# Patient Record
Sex: Female | Born: 1945 | Race: White | Hispanic: No | Marital: Married | Smoking: Never smoker
Health system: Southern US, Community
[De-identification: ages and names within clinical notes are randomized; demographics above are authoritative.]

## PROBLEM LIST (undated history)

## (undated) DIAGNOSIS — C801 Malignant (primary) neoplasm, unspecified: Secondary | ICD-10-CM

## (undated) DIAGNOSIS — C50919 Malignant neoplasm of unspecified site of unspecified female breast: Secondary | ICD-10-CM

## (undated) DIAGNOSIS — M549 Dorsalgia, unspecified: Secondary | ICD-10-CM

## (undated) DIAGNOSIS — K5790 Diverticulosis of intestine, part unspecified, without perforation or abscess without bleeding: Secondary | ICD-10-CM

## (undated) DIAGNOSIS — R739 Hyperglycemia, unspecified: Secondary | ICD-10-CM

## (undated) DIAGNOSIS — G8929 Other chronic pain: Secondary | ICD-10-CM

## (undated) DIAGNOSIS — H409 Unspecified glaucoma: Secondary | ICD-10-CM

## (undated) DIAGNOSIS — Z923 Personal history of irradiation: Secondary | ICD-10-CM

## (undated) DIAGNOSIS — R112 Nausea with vomiting, unspecified: Secondary | ICD-10-CM

## (undated) DIAGNOSIS — Z9889 Other specified postprocedural states: Secondary | ICD-10-CM

## (undated) DIAGNOSIS — D369 Benign neoplasm, unspecified site: Secondary | ICD-10-CM

## (undated) DIAGNOSIS — Z808 Family history of malignant neoplasm of other organs or systems: Secondary | ICD-10-CM

## (undated) DIAGNOSIS — M8580 Other specified disorders of bone density and structure, unspecified site: Secondary | ICD-10-CM

## (undated) DIAGNOSIS — J302 Other seasonal allergic rhinitis: Secondary | ICD-10-CM

## (undated) DIAGNOSIS — N189 Chronic kidney disease, unspecified: Secondary | ICD-10-CM

## (undated) DIAGNOSIS — Z803 Family history of malignant neoplasm of breast: Secondary | ICD-10-CM

## (undated) DIAGNOSIS — K219 Gastro-esophageal reflux disease without esophagitis: Secondary | ICD-10-CM

## (undated) HISTORY — PX: VAGINAL HYSTERECTOMY: SUR661

## (undated) HISTORY — DX: Other seasonal allergic rhinitis: J30.2

## (undated) HISTORY — PX: CATARACT EXTRACTION: SUR2

## (undated) HISTORY — DX: Diverticulosis of intestine, part unspecified, without perforation or abscess without bleeding: K57.90

## (undated) HISTORY — DX: Hyperglycemia, unspecified: R73.9

## (undated) HISTORY — PX: KIDNEY CYST REMOVAL: SHX684

## (undated) HISTORY — DX: Malignant neoplasm of unspecified site of unspecified female breast: C50.919

## (undated) HISTORY — DX: Other chronic pain: G89.29

## (undated) HISTORY — DX: Chronic kidney disease, unspecified: N18.9

## (undated) HISTORY — DX: Family history of malignant neoplasm of breast: Z80.3

## (undated) HISTORY — DX: Unspecified glaucoma: H40.9

## (undated) HISTORY — DX: Benign neoplasm, unspecified site: D36.9

## (undated) HISTORY — DX: Dorsalgia, unspecified: M54.9

## (undated) HISTORY — DX: Other specified disorders of bone density and structure, unspecified site: M85.80

## (undated) HISTORY — DX: Gastro-esophageal reflux disease without esophagitis: K21.9

## (undated) HISTORY — PX: APPENDECTOMY: SHX54

## (undated) HISTORY — PX: BREAST CYST EXCISION: SHX579

## (undated) HISTORY — PX: BREAST LUMPECTOMY: SHX2

## (undated) HISTORY — DX: Family history of malignant neoplasm of other organs or systems: Z80.8

---

## 2011-12-02 ENCOUNTER — Inpatient Hospital Stay (HOSPITAL_COMMUNITY): Payer: Medicare Other

## 2011-12-02 ENCOUNTER — Inpatient Hospital Stay (HOSPITAL_COMMUNITY)
Admission: AD | Admit: 2011-12-02 | Discharge: 2011-12-02 | Disposition: A | Discharge status: Home / Self Care | Payer: Medicare Other | Source: Ambulatory Visit | Attending: Obstetrics & Gynecology | Admitting: Obstetrics & Gynecology

## 2011-12-02 ENCOUNTER — Encounter (HOSPITAL_COMMUNITY): Payer: Self-pay | Admitting: *Deleted

## 2011-12-02 DIAGNOSIS — R109 Unspecified abdominal pain: Secondary | ICD-10-CM | POA: Insufficient documentation

## 2011-12-02 DIAGNOSIS — N289 Disorder of kidney and ureter, unspecified: Secondary | ICD-10-CM | POA: Insufficient documentation

## 2011-12-02 DIAGNOSIS — N390 Urinary tract infection, site not specified: Secondary | ICD-10-CM | POA: Insufficient documentation

## 2011-12-02 HISTORY — DX: Unspecified glaucoma: H40.9

## 2011-12-02 LAB — URINALYSIS, ROUTINE W REFLEX MICROSCOPIC
Bilirubin Urine: NEGATIVE
Glucose, UA: NEGATIVE mg/dL
Ketones, ur: NEGATIVE mg/dL
Nitrite: NEGATIVE
Specific Gravity, Urine: >1.03 — ABNORMAL HIGH (ref 1.005–1.030)
pH: 5.5 (ref 5.0–8.0)

## 2011-12-02 LAB — CBC
HCT: 38.7 % (ref 36.0–46.0)
Hemoglobin: 13.1 g/dL (ref 12.0–15.0)
MCH: 30.8 pg (ref 26.0–34.0)
MCHC: 33.9 g/dL (ref 30.0–36.0)
MCV: 91.1 fL (ref 78.0–100.0)
RBC: 4.25 MIL/uL (ref 3.87–5.11)

## 2011-12-02 LAB — URINE MICROSCOPIC-ADD ON

## 2011-12-02 MED ORDER — CEPHALEXIN 500 MG PO CAPS: 500.0000 mg | ORAL_CAPSULE | Freq: Three times a day (TID) | ORAL | Status: AC

## 2011-12-02 MED ORDER — FLAVOXATE HCL 100 MG PO TABS
100.0000 mg | ORAL_TABLET | Freq: Three times a day (TID) | ORAL | Status: DC | PRN
  Administered 2011-12-02: 100 mg via ORAL
  Filled 2011-12-02: qty 1

## 2011-12-02 MED ORDER — LACTATED RINGERS IV BOLUS (SEPSIS): 1000.0000 mL | Freq: Once | INTRAVENOUS | Status: DC

## 2011-12-02 MED ORDER — KETOROLAC TROMETHAMINE 60 MG/2ML IM SOLN
60.0000 mg | Freq: Once | INTRAMUSCULAR | Status: AC
  Administered 2011-12-02: 60 mg via INTRAMUSCULAR
  Filled 2011-12-02: qty 2

## 2011-12-02 MED ORDER — DEXTROSE 5 % IV SOLN
1.0000 g | Freq: Once | INTRAVENOUS | Status: AC
  Administered 2011-12-02: 1 g via INTRAVENOUS
  Filled 2011-12-02: qty 10

## 2011-12-02 MED ORDER — SODIUM CHLORIDE 0.9 % IV BOLUS (SEPSIS)
1000.0000 mL | Freq: Once | INTRAVENOUS | Status: AC
  Administered 2011-12-02: 1000 mL via INTRAVENOUS

## 2011-12-02 MED ORDER — FLAVOXATE HCL 100 MG PO TABS: 100.0000 mg | ORAL_TABLET | Freq: Three times a day (TID) | ORAL | Status: DC | PRN

## 2011-12-02 NOTE — MAU Provider Note (Signed)
History     CSN: [id number]  Arrival date and time: 12/02/11 1033   First Provider Initiated Contact with Patient 12/02/11 1124      No chief complaint on file.  HPI Pt is not pregnant(s/p hyst- has ovaries) and presents with bladder/lower abdominal pain and feeling bloated. She was seen at her doctor's office on Thursday Aug 1 and diagnosed with a UTI.  She was treated with Macrobid, however, pt had nausea and vomiting and chills and fever.  Pt was changed to Cipro but culture and sensitivity showed resistant to Cipro.  Pt was was given an injection or Rocephin on Friday Aug 2 in the office which seemed to help the pt's pain as well as an injection of ToradolYesterday, Sat Aug 2, pt's MD called to tell her the urine culture showed resistance to Cipro and was told to return to [facility] and was also given a presciption for Phenergan. Pt has continued to have nausea and vomiting and chills along with lower abdominal pain, frequency of urination, dysuria and feeling of incomplete emptying..had an injection Friday afternoon of Rocephin. 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  He goes to [facility] and sees for benign tumor on kidney 3 years ago.  She took Tylenol and Motrin for the fever and pain last took yesterday .  She is aching and uncomfortable in her lower abdomen.  Past Medical History  Diagnosis Date  . Glaucoma (increased eye pressure)     Past Surgical History  Procedure Date  . Abdominal hysterectomy   . Kidney cyst removal     No family history on file.  History  Substance Use Topics  . Smoking status: Never Smoker   . Smokeless tobacco: Not on file  . Alcohol Use: No    Allergies:  Allergies  Allergen Reactions  . Bactrim (Sulfamethoxazole W-Trimethoprim) Nausea And Vomiting    Prescriptions prior to admission  Medication Sig Dispense Refill  . acetaminophen (TYLENOL) 500 MG tablet Take 1,000 mg by mouth daily  as needed. For pain and fever      . Calcium Carbonate-Vitamin D (CALTRATE 600+D) 600-400 MG-UNIT per tablet Take 1 tablet by mouth 2 (two) times daily.      . CefTRIAXone Sodium (ROCEPHIN IJ) Inject 1 Syringe as directed once. Pt got a shot at the Dr Felicie Morn [place] in [place], [place]      . cholecalciferol (VITAMIN D) 1000 UNITS tablet Take 1,000 Units by mouth daily.      . ciprofloxacin (CIPRO) 500 MG tablet Take 500 mg by mouth 2 (two) times daily. Dr told her to stop taking Friday 11/30/2011      . Cyanocobalamin (VITAMIN B 12 PO) Take 1 tablet by mouth every morning.      . folic acid (FOLVITE) 400 MCG tablet Take 400 mcg by mouth daily.      Marland Kitchen ibuprofen (ADVIL,MOTRIN) 200 MG tablet Take 400 mg by mouth daily as needed. For pain      . nitrofurantoin, macrocrystal-monohydrate, (MACROBID) 100 MG capsule Take 100 mg by mouth 2 (two) times daily.      . promethazine (PHENERGAN) 25 MG tablet Take 25 mg by mouth every 8 (eight) hours as needed. For nausea      . timolol (TIMOPTIC) 0.5 % ophthalmic solution Place 1 drop into both eyes daily.      . vitamin E 400 UNIT capsule Take  400 Units by mouth daily.        Review of Systems  Constitutional: Positive for fever and chills.  Gastrointestinal: Positive for nausea, vomiting and abdominal pain. Negative for diarrhea and constipation.  Genitourinary: Positive for dysuria, urgency and frequency.  Musculoskeletal: Negative for myalgias.   Physical Exam   Blood pressure 99/74, pulse 96, temperature 97.8 F (36.6 C), temperature source Oral, resp. rate 18.  Physical Exam  Vitals reviewed. Constitutional: She is oriented to person, place, and time. She appears well-developed and well-nourished.  HENT:  Head: Normocephalic.  Eyes: Pupils are equal, round, and reactive to light.  Neck: Normal range of motion. Neck supple.  Cardiovascular: Normal rate.   Respiratory: Effort normal.  GI: Soft. Bowel sounds are normal. She  exhibits no distension. There is tenderness. There is no rebound and no guarding.  Musculoskeletal: Normal range of motion.  Neurological: She is alert and oriented to person, place, and time.  Skin: Skin is warm and dry.  Psychiatric: She has a normal mood and affect.    MAU Course  Procedures  Clinical Data: Pain  RENAL/URINARY TRACT ULTRASOUND COMPLETE  Comparison: None  Findings:  Right Kidney: Right kidney measures 9.8 cm. Normal in size and  parenchymal echogenicity. No evidence of mass or hydronephrosis.  Left Kidney: Left kidney measures 10.3 cm.Normal in size and  parenchymal echogenicity. There is a rounded, partially exophytic  structure arising from the mid to lower pole of the left kidney.  There is associated area of increased echogenicity and posterior  acoustic shadowing within this area which may represent  calcification.  Bladder: Appears normal for degree of bladder distention.  IMPRESSION:  1. No evidence for obstructive uropathy.  2. Indeterminant, partially exophytic solid structure arising from  the mid to inferior pole the left kidney. This may be related to  prior surgery. Without prior imaging for comparison I cannot rule  out the possibility of underlying mass lesion. Suggest follow-up  non emergent CT or MRI with contrast material for further  evaluation.  Original Report Authenticated By: Rosealee Albee, M.D.            External Result Report    Clinical Data: Pain  TRANSABDOMINAL AND TRANSVAGINAL ULTRASOUND OF PELVIS  Technique: Both transabdominal and transvaginal ultrasound  examinations of the pelvis were performed. Transabdominal  technique was performed for global imaging of the pelvis including  uterus, ovaries, adnexal regions, and pelvic cul-de-sac.  It was necessary to proceed with endovaginal exam following the  transabdominal exam to visualize the ovaries .  Comparison: None  Findings:  Uterus: Prior hysterectomy. Not  applicable.  Endometrium: Prior hysterectomy. Not applicable.  Right ovary: Appears normal measuring 3.0 x 1.5 x 1.3 cm.  Left ovary:  Appears normal measuring 2.0 x 0.8 x 1.4 cm.  Other Findings: No free fluid  IMPRESSION:  1. Normal appearance of the pelvis status post hysterectomy. No  acute findings noted.  Original Report Authenticated By: Rosealee Albee, M.D.     Pt given Toradol 60 mg IM and also Rocephin 1 gm IV Pt also given Urispas- pt felt much relief of pain    Assessment and Plan  UTI and abnormal appearing structure on left kidney on renal MRI Hx prior kidney surgery for benign tumor Pt discharged home with prescription for Keflex Advised pt to f/u with PCP and nephrologist for re-eval of kidneys Copy of ultrasound report given to pt to take to physician  [facility] 12/02/2011, 11:26 AM

## 2011-12-02 NOTE — MAU Note (Signed)
Pt reports she went to doctor on Thursday told she had a bladder infection. Given macrobid. She took it that night and got very sick to her stomach. Talked to dr on call and changed her to  Cipro and gave her phenergan for nausea. Md called her yesterday and told her the urine culture cipro would not treat her bladder infection to go back on macrobid. Pt took it last night and vomited again. Reports chills and fever and pain today. (gets care in [place]).

## 2011-12-06 NOTE — MAU Provider Note (Signed)
Attestation of Attending Supervision of Advanced Practitioner (CNM/NP): Evaluation and management procedures were performed by the Advanced Practitioner under my supervision and collaboration.  I have reviewed the Advanced Practitioner's note and chart, and I agree with the management and plan.  HARRAWAY-SMITH, Alynah Schone 3:57 PM     

## 2013-09-29 ENCOUNTER — Inpatient Hospital Stay (HOSPITAL_COMMUNITY)
Admission: AD | Admit: 2013-09-29 | Discharge: 2013-09-29 | Disposition: A | Discharge status: Home / Self Care | Payer: MEDICARE | Source: Ambulatory Visit | Attending: Obstetrics & Gynecology | Admitting: Obstetrics & Gynecology

## 2013-09-29 ENCOUNTER — Encounter (HOSPITAL_COMMUNITY): Payer: Self-pay | Admitting: *Deleted

## 2013-09-29 ENCOUNTER — Inpatient Hospital Stay (HOSPITAL_COMMUNITY): Payer: MEDICARE

## 2013-09-29 DIAGNOSIS — R102 Pelvic and perineal pain: Secondary | ICD-10-CM

## 2013-09-29 DIAGNOSIS — B373 Candidiasis of vulva and vagina: Secondary | ICD-10-CM | POA: Insufficient documentation

## 2013-09-29 DIAGNOSIS — R109 Unspecified abdominal pain: Secondary | ICD-10-CM | POA: Insufficient documentation

## 2013-09-29 DIAGNOSIS — R300 Dysuria: Secondary | ICD-10-CM | POA: Insufficient documentation

## 2013-09-29 DIAGNOSIS — N390 Urinary tract infection, site not specified: Secondary | ICD-10-CM | POA: Insufficient documentation

## 2013-09-29 DIAGNOSIS — B3731 Acute candidiasis of vulva and vagina: Secondary | ICD-10-CM | POA: Insufficient documentation

## 2013-09-29 LAB — URINALYSIS, ROUTINE W REFLEX MICROSCOPIC
BILIRUBIN URINE: NEGATIVE
Glucose, UA: NEGATIVE mg/dL
HGB URINE DIPSTICK: NEGATIVE
Ketones, ur: NEGATIVE mg/dL
Nitrite: NEGATIVE
PROTEIN: NEGATIVE mg/dL
SPECIFIC GRAVITY, URINE: 1.02 (ref 1.005–1.030)
UROBILINOGEN UA: 0.2 mg/dL (ref 0.0–1.0)
pH: 6 (ref 5.0–8.0)

## 2013-09-29 LAB — URINE MICROSCOPIC-ADD ON

## 2013-09-29 MED ORDER — OXYBUTYNIN CHLORIDE 5 MG PO TABS: 5.0000 mg | ORAL_TABLET | Freq: Three times a day (TID) | ORAL | Status: DC | PRN

## 2013-09-29 MED ORDER — CEFPODOXIME PROXETIL 200 MG PO TABS: 200.0000 mg | ORAL_TABLET | Freq: Two times a day (BID) | ORAL | Status: DC

## 2013-09-29 MED ORDER — FLUCONAZOLE 150 MG PO TABS
150.0000 mg | ORAL_TABLET | Freq: Once | ORAL | Status: DC
Start: 2013-09-29 — End: 2016-12-25

## 2013-09-29 MED ORDER — FLUCONAZOLE 150 MG PO TABS
150.0000 mg | ORAL_TABLET | Freq: Once | ORAL | Status: AC
  Administered 2013-09-29: 150 mg via ORAL
  Filled 2013-09-29: qty 1

## 2013-09-29 NOTE — MAU Provider Note (Signed)
History     CSN: [id number]  Arrival date and time: 09/29/13 1552   None     CC: Bladder pain, UTI x 1 month  HPI  Ms Denslow is a 68 yo who presents with bladder pain, dysuria, frequency and urgency ongoing for about a month. Recently treatedby PCP and GYN in [place], [place] for UTI with 2 courses of Cipro, unclear if urine culture was ever collected. Her symptoms have not improved. She was seen by her PCP today and was told there was no evidence of infeection in her urine and PCP recommended coming to [facility] for further eval, apparently for suspicion of ovarian process. Last dose of cipro was 4 days ago.   In further discussion with the pt, she states that the urinary frequency improved but she still feels dysuria from time to time and vague pelvic pressure.  The other new symptoms that has progressively been worsening is that when she sits it burns on her exterior skin all the way around her vagina and down to her bottom.  Treated by her gyn with 2 courses of cipro and then told to see pcp since it did not improve. When she went to her PCP he was concerned she seemed bloated and distended and wanted her to have a CT scan and urology referral. Pt asked if she could come to [facility] and that is why she is here.   No fevers, chills, nausea, vomiting, diarrhea, constipation, weight changes.     Past Medical History  Diagnosis Date  . Glaucoma (increased eye pressure)     Past Surgical History  Procedure Laterality Date  . Abdominal hysterectomy    . Kidney cyst removal      No family history on file.  History  Substance Use Topics  . Smoking status: Never Smoker   . Smokeless tobacco: Not on file  . Alcohol Use: No    Allergies:  Allergies  Allergen Reactions  . Bactrim [Sulfamethoxazole-Trimethoprim] Nausea And Vomiting    Prescriptions prior to admission  Medication Sig Dispense Refill  . acetaminophen (TYLENOL) 500 MG tablet Take 1,000 mg by mouth daily as  needed. For pain and fever      . Calcium Carbonate-Vitamin D (CALTRATE 600+D) 600-400 MG-UNIT per tablet Take 1 tablet by mouth 2 (two) times daily.      . cholecalciferol (VITAMIN D) 1000 UNITS tablet Take 1,000 Units by mouth daily.      . Cyanocobalamin (VITAMIN B 12 PO) Take 1 tablet by mouth every morning.      . flavoxATE (URISPAS) 100 MG tablet Take 1 tablet (100 mg total) by mouth 3 (three) times daily as needed.  30 tablet  0  . folic acid (FOLVITE) 563 MCG tablet Take 400 mcg by mouth daily.      Marland Kitchen ibuprofen (ADVIL,MOTRIN) 200 MG tablet Take 400 mg by mouth daily as needed. For pain      . promethazine (PHENERGAN) 25 MG tablet Take 25 mg by mouth every 8 (eight) hours as needed. For nausea      . timolol (TIMOPTIC) 0.5 % ophthalmic solution Place 1 drop into both eyes daily.      . vitamin E 400 UNIT capsule Take 400 Units by mouth daily.        Review of Systems  Constitutional: Negative for fever, chills and weight loss.  HENT: Negative for sore throat.   Eyes: Negative for blurred vision, double vision and pain.  Respiratory: Negative for cough,  shortness of breath and wheezing.   Cardiovascular: Negative for chest pain and palpitations.  Gastrointestinal: Positive for abdominal pain. Negative for heartburn, nausea, vomiting, diarrhea, constipation and blood in stool.       Suprapubic pain, burning, cramping, radiating around to LLQ and RLQ but pain here is milder  Genitourinary: Positive for dysuria, urgency, frequency and flank pain. Negative for hematuria.  Musculoskeletal: Positive for back pain. Negative for myalgias.       Chronic back pain, this pain today is different   Skin: Negative for rash.  Neurological: Negative for dizziness, loss of consciousness and headaches.  Endo/Heme/Allergies: Does not bruise/bleed easily.   Physical Exam   Blood pressure 135/79, pulse 62, temperature 97.7 F (36.5 C), temperature source Oral, resp. rate 18, height 5' 3.5" (1.613 m),  weight 74.39 kg (164 lb).  UA: yellow, clear, Neg Glc/Bil/Ketone/Protein/Nitrite/Hgb. Mod Leuk, 21-50 WBC, Few sq. epith.  Physical Exam  Constitutional: She is oriented to person, place, and time. She appears well-developed and well-nourished.  HENT:  Head: Normocephalic and atraumatic.  Eyes: EOM are normal. Pupils are equal, round, and reactive to light.  Neck: Normal range of motion. Neck supple.  Cardiovascular: Normal rate, regular rhythm and normal heart sounds.   No murmur heard. Respiratory: Effort normal and breath sounds normal. No respiratory distress. She has no wheezes.  GI: Soft. She exhibits no distension. There is tenderness. There is no rebound and no guarding.  TTP suprapubic  Genitourinary:  External erythema and irritation around both labia and perianal. Normal vaginal mucosa and intact vaginal cuff. No adnexal tenderness or fullness.   Musculoskeletal: Normal range of motion.  Neg Lloyd's sign bilaterally  Neurological: She is alert and oriented to person, place, and time.  Skin: Skin is warm and dry. She is not diaphoretic.    MAU Course  Procedures  MDM UA Urine cx  US pelvis  Exam   Assessment and Plan   68 yo who presents with pelvic pain and urinary urgency and dysuria after recent UTI and was sent for further imaging.    Urinary tract infection - UA with only leukocytes today - will send for cx and only treat if positive - suspect some of her symptoms are coming from bladder spasm - rx of oxybutinin for several weeks to see if this improves some.    Lower Abdominal pain - per PCP also with distension although none noted today on exam - US of the pelvis obtained to r/o ovarian process and this was negative.   - reassurance provided that this could likely be also due to bladder spasm as above.    Yeast infection -likely source of her burning pain - diflucan today - rx for repeat dose if not gone in 3 days  F/u with PCP in [place] if no  improvement or worsening.     Shaquella Stamant L Avangelina Flight 09/29/2013, 5:31 PM

## 2013-09-29 NOTE — MAU Note (Addendum)
Pt states pain started May 5 th with a bladder infection. Pt had 10 days of cipro and another round for 7 days. Pt states she did feel better after the first round of Anitbiotiics but never felt completely better.

## 2013-09-29 NOTE — Progress Notes (Signed)
Bimanual performed by Dr Olevia Bowens redness and irritation noted

## 2013-09-29 NOTE — MAU Note (Signed)
5th of May, dx with UTI; finished 2nd round of Cipro last Thurs (10day then 7 day).  Not feeling any better, called GYN- told to see primary.  Saw primary this morning- urine was clear.  Very tender, needs a CT.

## 2013-09-29 NOTE — Discharge Instructions (Signed)
Candidal Vulvovaginitis  Candidal vulvovaginitis is an infection of the vagina and vulva. The vulva is the skin around the opening of the vagina. This may cause itching and discomfort in and around the vagina.   HOME CARE  · Only take medicine as told by your doctor.  · Do not have sex (intercourse) until the infection is healed or as told by your doctor.  · Practice safe sex.  · Tell your sex partner about your infection.  · Do not douche or use tampons.  · Wear cotton underwear. Do not wear tight pants or panty hose.  · Eat yogurt. This may help treat and prevent yeast infections.  GET HELP RIGHT AWAY IF:   · You have a fever.  · Your problems get worse during treatment or do not get better in 3 days.  · You have discomfort, irritation, or itching in your vagina or vulva area.  · You have pain after sex.  · You start to get belly (abdominal) pain.  MAKE SURE YOU:  · Understand these instructions.  · Will watch your condition.  · Will get help right away if you are not doing well or get worse.  Document Released: 07/13/2008 Document Revised: 07/09/2011 Document Reviewed: 07/13/2008  Exit[employer]® Patient Information ©2014 [employer].

## 2013-10-01 LAB — URINE CULTURE: Special Requests: NORMAL

## 2014-03-01 ENCOUNTER — Encounter (HOSPITAL_COMMUNITY): Payer: Self-pay | Admitting: *Deleted

## 2016-11-07 ENCOUNTER — Encounter: Payer: Self-pay | Admitting: Gastroenterology

## 2016-12-25 ENCOUNTER — Ambulatory Visit (INDEPENDENT_AMBULATORY_CARE_PROVIDER_SITE_OTHER): Payer: Medicare Other | Admitting: Gastroenterology

## 2016-12-25 ENCOUNTER — Encounter (INDEPENDENT_AMBULATORY_CARE_PROVIDER_SITE_OTHER): Payer: Self-pay

## 2016-12-25 ENCOUNTER — Encounter: Payer: Self-pay | Admitting: Gastroenterology

## 2016-12-25 VITALS — BP 110/72 | HR 76 | Ht 64.0 in | Wt 170.6 lb

## 2016-12-25 DIAGNOSIS — K640 First degree hemorrhoids: Secondary | ICD-10-CM

## 2016-12-25 DIAGNOSIS — K625 Hemorrhage of anus and rectum: Secondary | ICD-10-CM | POA: Diagnosis not present

## 2016-12-25 DIAGNOSIS — R102 Pelvic and perineal pain: Secondary | ICD-10-CM | POA: Diagnosis not present

## 2016-12-25 NOTE — Progress Notes (Signed)
[facility] Consult Note:  History: Stacey Church 12/25/2016  Referring physician: Lavonia Drafts, MD  Reason for consult/chief complaint: lower abdominal pain (hurts all the way around, onset July 4th 2018, saw GYN this past Friday and she dx a muscle issue) and external hemorrhoid (onset Sep 21, 2016, bled)   Subjective  HPI:  This is a 71 year old woman referred by primary care noted above for abdominal pain and rectal bleeding. She reports the acute onset of bandlike lower abdominal/pelvic pain on July 4 of this year. She cannot recall any clear triggers at that time. She was also having some rectal burning with what sounds like a prolapsed hemorrhoid in May. She periodically has some perianal burning as well. Her bowel habits are regular. She recent saw a gynecologist who gave her some Flexeril for this pain, thinking it was muscular in origin. John reports she has had some relief from that, as she did been the same thing was done a few years prior. She also reports previous urologic difficulty and recently had microscopic blood detected in the urine. She is scheduled to see her urologist tomorrow.  She has a regular bowel movement one to 2 times daily and denies any upper GI symptoms.  ROS:  Review of Systems  Constitutional: Negative for appetite change and unexpected weight change.  HENT: Negative for mouth sores and voice change.   Eyes: Negative for pain and redness.  Respiratory: Negative for cough and shortness of breath.   Cardiovascular: Negative for chest pain and palpitations.  Genitourinary: Negative for dysuria and hematuria.  Musculoskeletal: Positive for back pain. Negative for arthralgias and myalgias.  Skin: Negative for pallor and rash.  Neurological: Negative for weakness and headaches.  Hematological: Negative for adenopathy.     Past Medical History: Past Medical History:  Diagnosis Date  . Chronic back pain   . Chronic  kidney disease (CKD), stage III (moderate)   . Diverticular disease   . GERD (gastroesophageal reflux disease)   . Glaucoma (increased eye pressure)   . Hyperglycemia   . Oncocytoma    of the kidney  . 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 Seasonal allergies      Past Surgical History: Past Surgical History:  Procedure Laterality Date  . APPENDECTOMY    . BREAST CYST EXCISION Bilateral    benign  . CATARACT EXTRACTION Bilateral    lens inplants  . KIDNEY CYST REMOVAL    . VAGINAL HYSTERECTOMY     partial     Family History: Family History  Problem Relation Age of Onset  . Diabetes Father   . Colon cancer Neg Hx   . Rectal cancer Neg Hx   . Throat cancer Neg Hx     Social History: Social History   Social History  . Marital status: Married    Spouse name: N/A  . Number of children: 2  . Years of education: N/A   Occupational History  . retired    Social History Main Topics  . Smoking status: Never Smoker  . Smokeless tobacco: Never Used  . Alcohol use No  . Drug use: No  . Sexual activity: Not Asked   Other Topics Concern  . None   Social History Narrative  . None    Allergies: Allergies  Allergen Reactions  . Bactrim [Sulfamethoxazole-Trimethoprim] Nausea And Vomiting  . Macrobid [Nitrofurantoin Monohyd Macro] Nausea And Vomiting    Outpatient Meds: Current Outpatient Prescriptions  Medication Sig Dispense Refill  . Calcium  Carbonate-Vitamin D (CALTRATE 600+D) 600-400 MG-UNIT per tablet Take 1 tablet by mouth 2 (two) times daily.    . cholecalciferol (VITAMIN D) 1000 UNITS tablet Take 1,000 Units by mouth daily.    . Cyanocobalamin (VITAMIN B 12 PO) Take 1 tablet by mouth every morning.    . folic acid (FOLVITE) 409 MCG tablet Take 400 mcg by mouth daily.    Marland Kitchen ibuprofen (ADVIL,MOTRIN) 200 MG tablet Take 400 mg by mouth daily as needed. For pain    . lansoprazole (PREVACID) 15 MG capsule Take 15 mg by mouth daily at 12 noon.    . timolol (TIMOPTIC)  0.5 % ophthalmic solution Place 1 drop into both eyes daily.    . vitamin E 400 UNIT capsule Take 400 Units by mouth daily.     No current facility-administered medications for this visit.       ___________________________________________________________________ Objective   Exam:  BP 110/72   Pulse 76   Ht 5\' 4"  (1.626 m)   Wt 170 lb 9.6 oz (77.4 kg)   BMI 29.28 kg/m    General: this is a(n) Well-appearing woman   Eyes: sclera anicteric, no redness  ENT: oral mucosa moist without lesions, no cervical or supraclavicular lymphadenopathy, good dentition  CV: RRR without murmur, S1/S2, no JVD, no peripheral edema  Resp: clear to auscultation bilaterally, normal RR and effort noted  GI: soft, no tenderness, with active bowel sounds. No guarding or palpable organomegaly noted.  Skin; warm and dry, no rash or jaundice noted  Neuro: awake, alert and oriented x 3. Normal gross motor function and fluent speech Rectal (with MA Magdalene River): NST, no fissure, firm stool, no mass Anoscopy: small int HR   Assessment: Encounter Diagnoses  Name Primary?  . Grade I internal hemorrhoids Yes  . Pelvic pain   . Rectal bleeding     She had symptomatic internal hemorrhoids a few months ago but it is not bothering her now. It is not clear that this pain is digestive in origin, as it is better with muscle relaxant. She reports being up-to-date with a colonoscopy in [place] 3 years ago.   Plan:  No further testing at present. I gave her instructions for [facility] care when needed, and I will gladly see her as needed.  Thank you for the courtesy of this consult.  Please call me with any questions or concerns.  Nelida Meuse III  CC: Lavonia Drafts, MD

## 2016-12-25 NOTE — Patient Instructions (Addendum)
If hemorrhoids flare on you again, use preparation H suppository twice daily for 2 days. Call me as needed.  If you are age 73 or older, your body mass index should be between 23-30. Your Body mass index is 29.28 kg/m. If this is out of the aforementioned range listed, please consider follow up with your Primary Care Provider.  If you are age 29 or younger, your body mass index should be between 19-25. Your Body mass index is 29.28 kg/m. If this is out of the aformentioned range listed, please consider follow up with your Primary Care Provider.   Thank you for choosing [facility]  Dr Wilfrid Lund III

## 2017-04-30 DIAGNOSIS — C50919 Malignant neoplasm of unspecified site of unspecified female breast: Secondary | ICD-10-CM

## 2017-04-30 HISTORY — DX: Malignant neoplasm of unspecified site of unspecified female breast: C50.919

## 2017-06-11 ENCOUNTER — Other Ambulatory Visit: Payer: Self-pay | Admitting: Obstetrics and Gynecology

## 2017-06-11 DIAGNOSIS — N6452 Nipple discharge: Secondary | ICD-10-CM

## 2017-06-18 ENCOUNTER — Ambulatory Visit
Admission: RE | Admit: 2017-06-18 | Discharge: 2017-06-18 | Disposition: A | Discharge status: Home / Self Care | Payer: Medicare Other | Source: Ambulatory Visit | Attending: Obstetrics and Gynecology | Admitting: Obstetrics and Gynecology

## 2017-06-18 DIAGNOSIS — N6452 Nipple discharge: Secondary | ICD-10-CM

## 2017-07-02 ENCOUNTER — Other Ambulatory Visit: Payer: Self-pay | Admitting: Obstetrics and Gynecology

## 2017-07-02 DIAGNOSIS — N6452 Nipple discharge: Secondary | ICD-10-CM

## 2017-07-09 ENCOUNTER — Ambulatory Visit
Admission: RE | Admit: 2017-07-09 | Discharge: 2017-07-09 | Disposition: A | Discharge status: Home / Self Care | Payer: Medicare Other | Source: Ambulatory Visit | Attending: Obstetrics and Gynecology | Admitting: Obstetrics and Gynecology

## 2017-07-09 DIAGNOSIS — N6452 Nipple discharge: Secondary | ICD-10-CM

## 2017-07-09 MED ORDER — GADOBENATE DIMEGLUMINE 529 MG/ML IV SOLN
15.0000 mL | Freq: Once | INTRAVENOUS | Status: AC | PRN
  Administered 2017-07-09: 15 mL via INTRAVENOUS

## 2017-07-26 ENCOUNTER — Other Ambulatory Visit: Payer: Self-pay | Admitting: General Surgery

## 2017-07-26 DIAGNOSIS — N6452 Nipple discharge: Secondary | ICD-10-CM

## 2017-08-05 ENCOUNTER — Ambulatory Visit
Admission: RE | Admit: 2017-08-05 | Discharge: 2017-08-05 | Disposition: A | Discharge status: Home / Self Care | Payer: Medicare Other | Source: Ambulatory Visit | Attending: General Surgery | Admitting: General Surgery

## 2017-08-05 DIAGNOSIS — N6452 Nipple discharge: Secondary | ICD-10-CM

## 2017-08-05 MED ORDER — GADOBENATE DIMEGLUMINE 529 MG/ML IV SOLN
15.0000 mL | Freq: Once | INTRAVENOUS | Status: AC | PRN
  Administered 2017-08-05: 15 mL via INTRAVENOUS

## 2017-08-08 ENCOUNTER — Encounter (HOSPITAL_BASED_OUTPATIENT_CLINIC_OR_DEPARTMENT_OTHER): Payer: Self-pay | Admitting: *Deleted

## 2017-08-08 ENCOUNTER — Other Ambulatory Visit: Payer: Self-pay | Admitting: General Surgery

## 2017-08-08 DIAGNOSIS — D0512 Intraductal carcinoma in situ of left breast: Secondary | ICD-10-CM

## 2017-08-12 ENCOUNTER — Telehealth: Payer: Self-pay | Admitting: Genetics

## 2017-08-12 ENCOUNTER — Encounter: Payer: Self-pay | Admitting: Genetics

## 2017-08-12 NOTE — Telephone Encounter (Signed)
A genetic counseling appt has been scheduled for the pt to see Ferol Luz on 5/28 at 1pm. Letter and directions mailed to the pt.

## 2017-08-13 ENCOUNTER — Ambulatory Visit
Admission: RE | Admit: 2017-08-13 | Discharge: 2017-08-13 | Disposition: A | Discharge status: Home / Self Care | Payer: Medicare Other | Source: Ambulatory Visit | Attending: General Surgery | Admitting: General Surgery

## 2017-08-13 ENCOUNTER — Other Ambulatory Visit: Payer: Self-pay | Admitting: General Surgery

## 2017-08-13 DIAGNOSIS — D0512 Intraductal carcinoma in situ of left breast: Secondary | ICD-10-CM

## 2017-08-14 ENCOUNTER — Encounter (HOSPITAL_BASED_OUTPATIENT_CLINIC_OR_DEPARTMENT_OTHER)
Admission: RE | Disposition: A | Discharge status: Home / Self Care | Payer: Self-pay | Source: Ambulatory Visit | Attending: General Surgery

## 2017-08-14 ENCOUNTER — Ambulatory Visit
Admission: RE | Admit: 2017-08-14 | Discharge: 2017-08-14 | Disposition: A | Discharge status: Home / Self Care | Payer: Medicare Other | Source: Ambulatory Visit | Attending: General Surgery | Admitting: General Surgery

## 2017-08-14 ENCOUNTER — Ambulatory Visit (HOSPITAL_BASED_OUTPATIENT_CLINIC_OR_DEPARTMENT_OTHER)
Admission: RE | Admit: 2017-08-14 | Discharge: 2017-08-14 | Disposition: A | Discharge status: Home / Self Care | Payer: Medicare Other | Source: Ambulatory Visit | Attending: General Surgery | Admitting: General Surgery

## 2017-08-14 ENCOUNTER — Other Ambulatory Visit: Payer: Self-pay | Admitting: *Deleted

## 2017-08-14 ENCOUNTER — Ambulatory Visit (HOSPITAL_BASED_OUTPATIENT_CLINIC_OR_DEPARTMENT_OTHER): Payer: Medicare Other | Admitting: Anesthesiology

## 2017-08-14 ENCOUNTER — Other Ambulatory Visit: Payer: Self-pay

## 2017-08-14 ENCOUNTER — Encounter (HOSPITAL_BASED_OUTPATIENT_CLINIC_OR_DEPARTMENT_OTHER): Payer: Self-pay | Admitting: Anesthesiology

## 2017-08-14 DIAGNOSIS — N183 Chronic kidney disease, stage 3 (moderate): Secondary | ICD-10-CM | POA: Insufficient documentation

## 2017-08-14 DIAGNOSIS — Z803 Family history of malignant neoplasm of breast: Secondary | ICD-10-CM | POA: Diagnosis not present

## 2017-08-14 DIAGNOSIS — H409 Unspecified glaucoma: Secondary | ICD-10-CM | POA: Insufficient documentation

## 2017-08-14 DIAGNOSIS — D0592 Unspecified type of carcinoma in situ of left breast: Secondary | ICD-10-CM | POA: Diagnosis present

## 2017-08-14 DIAGNOSIS — Z791 Long term (current) use of non-steroidal anti-inflammatories (NSAID): Secondary | ICD-10-CM | POA: Insufficient documentation

## 2017-08-14 DIAGNOSIS — K219 Gastro-esophageal reflux disease without esophagitis: Secondary | ICD-10-CM | POA: Diagnosis not present

## 2017-08-14 DIAGNOSIS — Z79899 Other long term (current) drug therapy: Secondary | ICD-10-CM | POA: Diagnosis not present

## 2017-08-14 DIAGNOSIS — D0512 Intraductal carcinoma in situ of left breast: Secondary | ICD-10-CM

## 2017-08-14 HISTORY — DX: Other specified postprocedural states: Z98.890

## 2017-08-14 HISTORY — DX: Nausea with vomiting, unspecified: R11.2

## 2017-08-14 HISTORY — PX: BREAST LUMPECTOMY WITH RADIOACTIVE SEED LOCALIZATION: SHX6424

## 2017-08-14 HISTORY — PX: BREAST BIOPSY: SHX20

## 2017-08-14 SURGERY — BREAST LUMPECTOMY WITH RADIOACTIVE SEED LOCALIZATION
Anesthesia: General | Site: Breast | Laterality: Left

## 2017-08-14 MED ORDER — CEFAZOLIN SODIUM-DEXTROSE 2-3 GM-%(50ML) IV SOLR
INTRAVENOUS | Status: DC | PRN
  Administered 2017-08-14: 2 g via INTRAVENOUS

## 2017-08-14 MED ORDER — DEXAMETHASONE SODIUM PHOSPHATE 4 MG/ML IJ SOLN
INTRAMUSCULAR | Status: DC | PRN
  Administered 2017-08-14: 10 mg via INTRAVENOUS

## 2017-08-14 MED ORDER — FENTANYL CITRATE (PF) 100 MCG/2ML IJ SOLN: 25.0000 ug | INTRAMUSCULAR | Status: DC | PRN

## 2017-08-14 MED ORDER — ACETAMINOPHEN 500 MG PO TABS
ORAL_TABLET | ORAL | Status: AC
  Filled 2017-08-14: qty 2

## 2017-08-14 MED ORDER — GABAPENTIN 100 MG PO CAPS
100.0000 mg | ORAL_CAPSULE | ORAL | Status: AC
  Administered 2017-08-14: 300 mg via ORAL

## 2017-08-14 MED ORDER — BUPIVACAINE HCL (PF) 0.25 % IJ SOLN
INTRAMUSCULAR | Status: DC | PRN
  Administered 2017-08-14: 10 mL

## 2017-08-14 MED ORDER — LACTATED RINGERS IV SOLN
INTRAVENOUS | Status: DC
  Administered 2017-08-14: 11:00:00 via INTRAVENOUS

## 2017-08-14 MED ORDER — FENTANYL CITRATE (PF) 100 MCG/2ML IJ SOLN: 50.0000 ug | INTRAMUSCULAR | Status: DC | PRN

## 2017-08-14 MED ORDER — GABAPENTIN 300 MG PO CAPS
ORAL_CAPSULE | ORAL | Status: AC
  Filled 2017-08-14: qty 1

## 2017-08-14 MED ORDER — PROPOFOL 10 MG/ML IV BOLUS
INTRAVENOUS | Status: DC | PRN
  Administered 2017-08-14: 150 mg via INTRAVENOUS

## 2017-08-14 MED ORDER — FENTANYL CITRATE (PF) 100 MCG/2ML IJ SOLN
INTRAMUSCULAR | Status: AC
  Filled 2017-08-14: qty 2

## 2017-08-14 MED ORDER — PROPOFOL 10 MG/ML IV BOLUS
INTRAVENOUS | Status: AC
  Filled 2017-08-14: qty 20

## 2017-08-14 MED ORDER — CHLORHEXIDINE GLUCONATE CLOTH 2 % EX PADS: 6.0000 | MEDICATED_PAD | Freq: Once | CUTANEOUS | Status: DC

## 2017-08-14 MED ORDER — ACETAMINOPHEN 500 MG PO TABS
1000.0000 mg | ORAL_TABLET | ORAL | Status: AC
  Administered 2017-08-14: 1000 mg via ORAL

## 2017-08-14 MED ORDER — LIDOCAINE HCL (CARDIAC) 20 MG/ML IV SOLN
INTRAVENOUS | Status: AC
Start: 2017-08-14 — End: ?
  Filled 2017-08-14: qty 5

## 2017-08-14 MED ORDER — LIDOCAINE HCL (CARDIAC) PF 100 MG/5ML IV SOSY
PREFILLED_SYRINGE | INTRAVENOUS | Status: DC | PRN
  Administered 2017-08-14: 30 mg via INTRAVENOUS

## 2017-08-14 MED ORDER — KETOROLAC TROMETHAMINE 30 MG/ML IJ SOLN: 15.0000 mg | Freq: Once | INTRAMUSCULAR | Status: DC | PRN

## 2017-08-14 MED ORDER — SCOPOLAMINE 1 MG/3DAYS TD PT72: 1.0000 | MEDICATED_PATCH | Freq: Once | TRANSDERMAL | Status: DC | PRN

## 2017-08-14 MED ORDER — CEFAZOLIN SODIUM-DEXTROSE 2-4 GM/100ML-% IV SOLN: 2.0000 g | INTRAVENOUS | Status: DC

## 2017-08-14 MED ORDER — MIDAZOLAM HCL 2 MG/2ML IJ SOLN: 1.0000 mg | INTRAMUSCULAR | Status: DC | PRN

## 2017-08-14 MED ORDER — CEFAZOLIN SODIUM-DEXTROSE 2-4 GM/100ML-% IV SOLN
INTRAVENOUS | Status: AC
  Filled 2017-08-14: qty 100

## 2017-08-14 MED ORDER — ENSURE PRE-SURGERY PO LIQD: 296.0000 mL | Freq: Once | ORAL | Status: DC

## 2017-08-14 MED ORDER — PROMETHAZINE HCL 25 MG/ML IJ SOLN: 6.2500 mg | INTRAMUSCULAR | Status: DC | PRN

## 2017-08-14 MED ORDER — DEXAMETHASONE SODIUM PHOSPHATE 10 MG/ML IJ SOLN
INTRAMUSCULAR | Status: AC
Start: 2017-08-14 — End: ?
  Filled 2017-08-14: qty 1

## 2017-08-14 MED ORDER — FENTANYL CITRATE (PF) 100 MCG/2ML IJ SOLN
INTRAMUSCULAR | Status: DC | PRN
  Administered 2017-08-14: 100 ug via INTRAVENOUS

## 2017-08-14 MED ORDER — EPHEDRINE SULFATE 50 MG/ML IJ SOLN
INTRAMUSCULAR | Status: DC | PRN
  Administered 2017-08-14: 10 mg via INTRAVENOUS

## 2017-08-14 MED ORDER — ONDANSETRON HCL 4 MG/2ML IJ SOLN
INTRAMUSCULAR | Status: AC
  Filled 2017-08-14: qty 2

## 2017-08-14 MED ORDER — LACTATED RINGERS IV SOLN
INTRAVENOUS | Status: DC | PRN
  Administered 2017-08-14: 11:00:00 via INTRAVENOUS

## 2017-08-14 SURGICAL SUPPLY — 57 items
APPLIER CLIP 9.375 MED OPEN (MISCELLANEOUS)
BINDER BREAST LRG (GAUZE/BANDAGES/DRESSINGS) ×3 IMPLANT
BINDER BREAST MEDIUM (GAUZE/BANDAGES/DRESSINGS) IMPLANT
BINDER BREAST XLRG (GAUZE/BANDAGES/DRESSINGS) IMPLANT
BINDER BREAST XXLRG (GAUZE/BANDAGES/DRESSINGS) IMPLANT
BLADE SURG 15 STRL LF DISP TIS (BLADE) ×1 IMPLANT
BLADE SURG 15 STRL SS (BLADE) ×2
CANISTER SUC SOCK COL 7IN (MISCELLANEOUS) IMPLANT
CANISTER SUCT 1200ML W/VALVE (MISCELLANEOUS) IMPLANT
CHLORAPREP W/TINT 26ML (MISCELLANEOUS) ×3 IMPLANT
CLIP APPLIE 9.375 MED OPEN (MISCELLANEOUS) IMPLANT
CLIP VESOCCLUDE SM WIDE 6/CT (CLIP) ×3 IMPLANT
CLOSURE WOUND 1/2 X4 (GAUZE/BANDAGES/DRESSINGS) ×1
COVER BACK TABLE 60X90IN (DRAPES) ×3 IMPLANT
COVER MAYO STAND STRL (DRAPES) ×3 IMPLANT
COVER PROBE W GEL 5X96 (DRAPES) ×3 IMPLANT
DECANTER SPIKE VIAL GLASS SM (MISCELLANEOUS) IMPLANT
DERMABOND ADVANCED (GAUZE/BANDAGES/DRESSINGS) ×2
DERMABOND ADVANCED .7 DNX12 (GAUZE/BANDAGES/DRESSINGS) ×1 IMPLANT
DEVICE DUBIN W/COMP PLATE 8390 (MISCELLANEOUS) ×3 IMPLANT
DRAPE LAPAROSCOPIC ABDOMINAL (DRAPES) ×3 IMPLANT
DRAPE UTILITY XL STRL (DRAPES) ×3 IMPLANT
DRSG TEGADERM 4X4.75 (GAUZE/BANDAGES/DRESSINGS) IMPLANT
ELECT COATED BLADE 2.86 ST (ELECTRODE) ×3 IMPLANT
ELECT REM PT RETURN 9FT ADLT (ELECTROSURGICAL) ×3
ELECTRODE REM PT RTRN 9FT ADLT (ELECTROSURGICAL) ×1 IMPLANT
GAUZE SPONGE 4X4 12PLY STRL LF (GAUZE/BANDAGES/DRESSINGS) IMPLANT
GLOVE BIO SURGEON STRL SZ7 (GLOVE) ×6 IMPLANT
GLOVE BIOGEL PI IND STRL 7.5 (GLOVE) ×1 IMPLANT
GLOVE BIOGEL PI INDICATOR 7.5 (GLOVE) ×2
GOWN STRL REUS W/ TWL LRG LVL3 (GOWN DISPOSABLE) ×2 IMPLANT
GOWN STRL REUS W/TWL LRG LVL3 (GOWN DISPOSABLE) ×4
HEMOSTAT ARISTA ABSORB 3G PWDR (MISCELLANEOUS) IMPLANT
ILLUMINATOR WAVEGUIDE N/F (MISCELLANEOUS) IMPLANT
KIT MARKER MARGIN INK (KITS) ×3 IMPLANT
LIGHT WAVEGUIDE WIDE FLAT (MISCELLANEOUS) IMPLANT
NEEDLE HYPO 25X1 1.5 SAFETY (NEEDLE) ×3 IMPLANT
NS IRRIG 1000ML POUR BTL (IV SOLUTION) IMPLANT
PACK BASIN DAY SURGERY FS (CUSTOM PROCEDURE TRAY) ×3 IMPLANT
PENCIL BUTTON HOLSTER BLD 10FT (ELECTRODE) ×3 IMPLANT
SLEEVE SCD COMPRESS KNEE MED (MISCELLANEOUS) ×3 IMPLANT
SPONGE LAP 4X18 X RAY DECT (DISPOSABLE) ×3 IMPLANT
STRIP CLOSURE SKIN 1/2X4 (GAUZE/BANDAGES/DRESSINGS) ×2 IMPLANT
SUT MNCRL AB 4-0 PS2 18 (SUTURE) ×3 IMPLANT
SUT MON AB 5-0 PS2 18 (SUTURE) IMPLANT
SUT SILK 2 0 SH (SUTURE) IMPLANT
SUT VIC AB 2-0 SH 27 (SUTURE) ×2
SUT VIC AB 2-0 SH 27XBRD (SUTURE) ×1 IMPLANT
SUT VIC AB 3-0 SH 27 (SUTURE) ×2
SUT VIC AB 3-0 SH 27X BRD (SUTURE) ×1 IMPLANT
SUT VIC AB 5-0 PS2 18 (SUTURE) IMPLANT
SYR CONTROL 10ML LL (SYRINGE) ×3 IMPLANT
TOWEL OR 17X24 6PK STRL BLUE (TOWEL DISPOSABLE) ×3 IMPLANT
TOWEL OR NON WOVEN STRL DISP B (DISPOSABLE) ×3 IMPLANT
TUBE CONNECTING 20'X1/4 (TUBING) ×1
TUBE CONNECTING 20X1/4 (TUBING) ×2 IMPLANT
YANKAUER SUCT BULB TIP NO VENT (SUCTIONS) ×3 IMPLANT

## 2017-08-14 NOTE — Interval H&P Note (Signed)
History and Physical Interval Note:  08/14/2017 10:47 AM  Stacey Church  has presented today for surgery, with the diagnosis of LEFT BREAST DCIS  The various methods of treatment have been discussed with the patient and family. After consideration of risks, benefits and other options for treatment, the patient has consented to  Procedure(s): BREAST LUMPECTOMY WITH RADIOACTIVE SEED LOCALIZATION (Left) as a surgical intervention .  The patient's history has been reviewed, patient examined, no change in status, stable for surgery.  I have reviewed the patient's chart and labs.  Questions were answered to the patient's satisfaction.     Rolm Bookbinder

## 2017-08-14 NOTE — Transfer of Care (Signed)
Immediate Anesthesia Transfer of Care Note  Patient: Stacey Church  Procedure(s) Performed: BREAST LUMPECTOMY WITH RADIOACTIVE SEED LOCALIZATION (Left Breast)  Patient Location: PACU  Anesthesia Type:General  Level of Consciousness: sedated  Airway & Oxygen Therapy: Patient Spontanous Breathing and Patient connected to face mask oxygen  Post-op Assessment: Report given to RN and Post -op Vital signs reviewed and stable  Post vital signs: Reviewed and stable  Last Vitals:  Vitals Value Taken Time  BP    Temp    Pulse 86 08/14/2017 11:52 AM  Resp    SpO2 96 % 08/14/2017 11:52 AM  Vitals shown include unvalidated device data.  Last Pain:  Vitals:   08/14/17 1013  TempSrc: Oral         Complications: No apparent anesthesia complications

## 2017-08-14 NOTE — Anesthesia Procedure Notes (Signed)
Procedure Name: LMA Insertion Date/Time: 08/14/2017 11:12 AM Performed by: Marrianne Mood, CRNA Pre-anesthesia Checklist: Patient identified, Emergency Drugs available, Suction available, Patient being monitored and Timeout performed Patient Re-evaluated:Patient Re-evaluated prior to induction Oxygen Delivery Method: Circle system utilized Preoxygenation: Pre-oxygenation with 100% oxygen Induction Type: IV induction Ventilation: Mask ventilation without difficulty LMA: LMA inserted LMA Size: 4.0 Number of attempts: 1 Airway Equipment and Method: Bite block Placement Confirmation: positive ETCO2 Tube secured with: Tape Dental Injury: Teeth and Oropharynx as per pre-operative assessment

## 2017-08-14 NOTE — H&P (View-Only) (Signed)
Stacey Church is an 72 y.o. female.   Chief Complaint: breast cancer HPI:  23 yof referred by Dr Helane Rima for left nipple discharge. she has history of chronically inverted left nipple after a benign excisional biopsy 30 years ago. this has not changed during that time. occasionally over this entire time she has noted a scab on her nipple which resolves. she has history of cyst aspirations on right. she has fh of breast cancer in a 21 yo niece, no ovarian cancer. she noted a small spot of blood on her bra recently. no active discharge. she underwent mm that shows c density breasts. there is inversion of left nipple. mm otherwise nl. [place] of left breast retroareolar tissue is normal. she then underwent mri that shows a nl right breast, nodes all normal and a 4.4x2x3.5 cm area of nme in the left breast. she now has undergone core biopsy that is hg dcis with alh. er/pr pending now. she is here with her husband to discuss options   Past Medical History:  Diagnosis Date  . Chronic back pain   . Chronic kidney disease (CKD), stage III (moderate) (HCC)   . Diverticular disease   . GERD (gastroesophageal reflux disease)   . Glaucoma (increased eye pressure)   . Hyperglycemia   . Oncocytoma    of the kidney  . PONV (postoperative nausea and vomiting)   . 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 Seasonal allergies     Past Surgical History:  Procedure Laterality Date  . APPENDECTOMY    . BREAST CYST EXCISION Bilateral    benign  . CATARACT EXTRACTION Bilateral    lens inplants  . KIDNEY CYST REMOVAL    . VAGINAL HYSTERECTOMY     partial    Family History  Problem Relation Age of Onset  . Diabetes Father   . Colon cancer Neg Hx   . Rectal cancer Neg Hx   . Throat cancer Neg Hx    Social History:  reports that she has never smoked. She has never used smokeless tobacco. She reports that she does not drink alcohol or use drugs.  Allergies:  Allergies  Allergen Reactions  . Bactrim  [Sulfamethoxazole-Trimethoprim] Nausea And Vomiting  . Macrobid [Nitrofurantoin Monohyd Macro] Nausea And Vomiting    Medications Prior to Admission  Medication Sig Dispense Refill  . Calcium Carbonate-Vitamin D (CALTRATE 600+D) 600-400 MG-UNIT per tablet Take 1 tablet by mouth 2 (two) times daily.    . cholecalciferol (VITAMIN D) 1000 UNITS tablet Take 1,000 Units by mouth daily.    . Cyanocobalamin (VITAMIN B 12 PO) Take 1 tablet by mouth every morning.    . folic acid (FOLVITE) 585 MCG tablet Take 400 mcg by mouth daily.    Marland Kitchen ibuprofen (ADVIL,MOTRIN) 200 MG tablet Take 400 mg by mouth daily as needed. For pain    . lansoprazole (PREVACID) 15 MG capsule Take 15 mg by mouth daily at 12 noon.    . timolol (TIMOPTIC) 0.5 % ophthalmic solution Place 1 drop into both eyes daily.    . vitamin E 400 UNIT capsule Take 400 Units by mouth daily.      No results found for this or any previous visit (from the past 48 hour(s)). 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 EXAM: MAMMOGRAPHIC GUIDED RADIOACTIVE SEED LOCALIZATION OF THE LEFT BREAST COMPARISON:  08/05/2017 and earlier FINDINGS: Patient presents  for radioactive seed localization prior to lumpectomy. I met with the patient and we discussed the procedure of seed localization including benefits and alternatives. We discussed the high likelihood of a successful procedure. We discussed the risks of the procedure including infection, bleeding, tissue injury and further surgery. We discussed the low dose of radioactivity involved in the procedure. Informed, written consent was given. The usual time-out protocol was performed immediately prior to the procedure. Using mammographic guidance, sterile technique, 1% lidocaine and an I-125 radioactive seed, the dumbbell-shaped clip was localized using a LATERAL approach. The follow-up mammogram  images confirm the seed in the expected location and were marked for Dr. Donne Hazel. Follow-up survey of the patient confirms presence of the radioactive seed. Order number of I-125 seed:  [id number]. Total activity:  9.758 millicuries reference Date: 08/05/2017 The patient tolerated the procedure well and was released from the [facility]. She was given instructions regarding seed removal. IMPRESSION: Radioactive seed localization LEFT breast. No apparent complications. Electronically Signed   By: Nolon Nations M.D.   On: 08/13/2017 14:52    Review of Systems  All other systems reviewed and are negative.   Blood pressure 110/62, pulse (!) 59, temperature 97.7 F (36.5 C), temperature source Oral, resp. rate 18, height 5' 4"  (1.626 m), weight 78 kg (172 lb), SpO2 99 %. Physical Exam  Vitals Andee Poles Gerrigner CMA; 08/08/2017 8:32 AM) 08/08/2017 8:32 AM Weight: 172.38 lb Height: 64in Body Surface Area: 1.84 m Body Mass Index: 29.59 kg/m  Temp.: 97.35F(Oral)  Pulse: 64 (Regular)  BP: 122/84 (Sitting, Left Arm, Standard) Physical Exam Rolm Bookbinder MD; 08/08/2017 12:12 PM) General Mental Status-Alert. Orientation-Oriented X3. Breast Note: no left breast hematoma cv rrr Lungs clear  Assessment/Plan BREAST NEOPLASM, TIS (DCIS), LEFT (D05.12) Story: genetics referral with new dx and three relatives with bca, left seed guided lumpectomy We discussed the staging and pathophysiology of breast cancer. We discussed all of the different options for treatment for breast cancer including surgery, chemotherapy, radiation therapy, Herceptin, and antiestrogen therapy. We discussed the options for treatment of the breast cancer which included lumpectomy versus a mastectomy. We discussed the performance of the lumpectomy with radioactive seed placement. We discussed a 5-10% chance of a positive margin requiring reexcision in the operating room. We also discussed that she will  likely need radiation therapy if she undergoes lumpectomy. We discussed the mastectomy (removal of whole breast) and the postoperative care for that as well. Mastectomy can be followed by reconstruction. This is a more extensive surgery and requires more recovery. The decision for lumpectomy vs mastectomy has no impact on decision for chemotherapy. Most mastectomy patients will not need radiation therapy. We discussed that there is no difference in her survival or really local recurrence. I think reasonable to do lumpectomy and await pathology. I dont think at this point she needs nac excised. I think much of those changes are chronic but if that margin is positive she will need nac excised also. she understands there is chance based on path that I might even recommend mastectomy later. I do not think she needs sn now. she understands if we find invasive disease on specimen that she may need to return for sn biopsy depending on what path says.  Rolm Bookbinder, MD 08/14/2017, 10:46 AM

## 2017-08-14 NOTE — Discharge Instructions (Signed)
°Post Anesthesia Home Care Instructions ° °Activity: °Get plenty of rest for the remainder of the day. A responsible individual must stay with you for 24 hours following the procedure.  °For the next 24 hours, DO NOT: °-Drive a car °-Operate machinery °-Drink alcoholic beverages °-Take any medication unless instructed by your physician °-Make any legal decisions or sign important papers. ° °Meals: °Start with liquid foods such as gelatin or soup. Progress to regular foods as tolerated. Avoid greasy, spicy, heavy foods. If nausea and/or vomiting occur, drink only clear liquids until the nausea and/or vomiting subsides. Call your physician if vomiting continues. ° °Special Instructions/Symptoms: °Your throat may feel dry or sore from the anesthesia or the breathing tube placed in your throat during surgery. If this causes discomfort, gargle with warm salt water. The discomfort should disappear within 24 hours. ° °If you had a scopolamine patch placed behind your ear for the management of post- operative nausea and/or vomiting: ° °1. The medication in the patch is effective for 72 hours, after which it should be removed.  Wrap patch in a tissue and discard in the trash. Wash hands thoroughly with soap and water. °2. You may remove the patch earlier than 72 hours if you experience unpleasant side effects which may include dry mouth, dizziness or visual disturbances. °3. Avoid touching the patch. Wash your hands with soap and water after contact with the patch. °  ° ° ° °[facility] °Office Phone Number [phone] ° °POST OP INSTRUCTIONS ° °Always review your discharge instruction sheet given to you by the facility where your surgery was performed. ° °IF YOU HAVE DISABILITY OR FAMILY LEAVE FORMS, YOU MUST BRING THEM TO THE OFFICE FOR PROCESSING.  DO NOT GIVE THEM TO YOUR DOCTOR. ° °1. A prescription for pain medication may be given to you upon discharge.  Take your pain medication as prescribed, if  needed.  If narcotic pain medicine is not needed, then you may take acetaminophen (Tylenol), naprosyn (Alleve) or ibuprofen (Advil) as needed. °2. Take your usually prescribed medications unless otherwise directed °3. If you need a refill on your pain medication, please contact your pharmacy.  They will contact our office to request authorization.  Prescriptions will not be filled after 5pm or on week-ends. °4. You should eat very light the first 24 hours after surgery, such as soup, crackers, pudding, etc.  Resume your normal diet the day after surgery. °5. Most patients will experience some swelling and bruising in the breast.  Ice packs and a good support bra will help.  Wear the breast binder provided or a sports bra for 72 hours day and night.  After that wear a sports bra during the day until you return to the office. Swelling and bruising can take several days to resolve.  °6. It is common to experience some constipation if taking pain medication after surgery.  Increasing fluid intake and taking a stool softener will usually help or prevent this problem from occurring.  A mild laxative (Milk of Magnesia or Miralax) should be taken according to package directions if there are no bowel movements after 48 hours. °7. Unless discharge instructions indicate otherwise, you may remove your bandages 48 hours after surgery and you may shower at that time.  You may have steri-strips (small skin tapes) in place directly over the incision.  These strips should be left on the skin for 7-10 days and will come off on their own.  If your surgeon used skin glue   on the incision, you may shower in 24 hours.  The glue will flake off over the next 2-3 weeks.  Any sutures or staples will be removed at the office during your follow-up visit. °8. ACTIVITIES:  You may resume regular daily activities (gradually increasing) beginning the next day.  Wearing a good support bra or sports bra minimizes pain and swelling.  You may have  sexual intercourse when it is comfortable. °a. You may drive when you no longer are taking prescription pain medication, you can comfortably wear a seatbelt, and you can safely maneuver your car and apply brakes. °b. RETURN TO WORK:  ______________________________________________________________________________________ °9. You should see your doctor in the office for a follow-up appointment approximately two weeks after your surgery.  Your doctor’s nurse will typically make your follow-up appointment when she calls you with your pathology report.  Expect your pathology report 3-4 business days after your surgery.  You may call to check if you do not hear from us after three days. °10. OTHER INSTRUCTIONS: _______________________________________________________________________________________________ _____________________________________________________________________________________________________________________________________ °_____________________________________________________________________________________________________________________________________ °_____________________________________________________________________________________________________________________________________ ° °WHEN TO CALL DR WAKEFIELD: °1. Fever over 101.0 °2. Nausea and/or vomiting. °3. Extreme swelling or bruising. °4. Continued bleeding from incision. °5. Increased pain, redness, or drainage from the incision. ° °The clinic staff is available to answer your questions during regular business hours.  Please don’t hesitate to call and ask to speak to one of the nurses for clinical concerns.  If you have a medical emergency, go to the nearest emergency room or call 911.  A surgeon from [facility] is always on call at the hospital. ° °For further questions, please visit [email or web address] ° °

## 2017-08-14 NOTE — H&P (Signed)
Stacey Church is an 72 y.o. female.   Chief Complaint: breast cancer HPI:  58 yof referred by Dr Helane Rima for left nipple discharge. she has history of chronically inverted left nipple after a benign excisional biopsy 30 years ago. this has not changed during that time. occasionally over this entire time she has noted a scab on her nipple which resolves. she has history of cyst aspirations on right. she has fh of breast cancer in a 61 yo niece, no ovarian cancer. she noted a small spot of blood on her bra recently. no active discharge. she underwent mm that shows c density breasts. there is inversion of left nipple. mm otherwise nl. [place] of left breast retroareolar tissue is normal. she then underwent mri that shows a nl right breast, nodes all normal and a 4.4x2x3.5 cm area of nme in the left breast. she now has undergone core biopsy that is hg dcis with alh. er/pr pending now. she is here with her husband to discuss options   Past Medical History:  Diagnosis Date  . Chronic back pain   . Chronic kidney disease (CKD), stage III (moderate) (HCC)   . Diverticular disease   . GERD (gastroesophageal reflux disease)   . Glaucoma (increased eye pressure)   . Hyperglycemia   . Oncocytoma    of the kidney  . PONV (postoperative nausea and vomiting)   . 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 Seasonal allergies     Past Surgical History:  Procedure Laterality Date  . APPENDECTOMY    . BREAST CYST EXCISION Bilateral    benign  . CATARACT EXTRACTION Bilateral    lens inplants  . KIDNEY CYST REMOVAL    . VAGINAL HYSTERECTOMY     partial    Family History  Problem Relation Age of Onset  . Diabetes Father   . Colon cancer Neg Hx   . Rectal cancer Neg Hx   . Throat cancer Neg Hx    Social History:  reports that she has never smoked. She has never used smokeless tobacco. She reports that she does not drink alcohol or use drugs.  Allergies:  Allergies  Allergen Reactions  . Bactrim  [Sulfamethoxazole-Trimethoprim] Nausea And Vomiting  . Macrobid [Nitrofurantoin Monohyd Macro] Nausea And Vomiting    Medications Prior to Admission  Medication Sig Dispense Refill  . Calcium Carbonate-Vitamin D (CALTRATE 600+D) 600-400 MG-UNIT per tablet Take 1 tablet by mouth 2 (two) times daily.    . cholecalciferol (VITAMIN D) 1000 UNITS tablet Take 1,000 Units by mouth daily.    . Cyanocobalamin (VITAMIN B 12 PO) Take 1 tablet by mouth every morning.    . folic acid (FOLVITE) 768 MCG tablet Take 400 mcg by mouth daily.    Marland Kitchen ibuprofen (ADVIL,MOTRIN) 200 MG tablet Take 400 mg by mouth daily as needed. For pain    . lansoprazole (PREVACID) 15 MG capsule Take 15 mg by mouth daily at 12 noon.    . timolol (TIMOPTIC) 0.5 % ophthalmic solution Place 1 drop into both eyes daily.    . vitamin E 400 UNIT capsule Take 400 Units by mouth daily.      No results found for this or any previous visit (from the past 48 hour(s)). 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 EXAM: MAMMOGRAPHIC GUIDED RADIOACTIVE SEED LOCALIZATION OF THE LEFT BREAST COMPARISON:  08/05/2017 and earlier FINDINGS: Patient presents  for radioactive seed localization prior to lumpectomy. I met with the patient and we discussed the procedure of seed localization including benefits and alternatives. We discussed the high likelihood of a successful procedure. We discussed the risks of the procedure including infection, bleeding, tissue injury and further surgery. We discussed the low dose of radioactivity involved in the procedure. Informed, written consent was given. The usual time-out protocol was performed immediately prior to the procedure. Using mammographic guidance, sterile technique, 1% lidocaine and an I-125 radioactive seed, the dumbbell-shaped clip was localized using a LATERAL approach. The follow-up mammogram  images confirm the seed in the expected location and were marked for Dr. Donne Hazel. Follow-up survey of the patient confirms presence of the radioactive seed. Order number of I-125 seed:  [id number]. Total activity:  1.700 millicuries reference Date: 08/05/2017 The patient tolerated the procedure well and was released from the [facility]. She was given instructions regarding seed removal. IMPRESSION: Radioactive seed localization LEFT breast. No apparent complications. Electronically Signed   By: Nolon Nations M.D.   On: 08/13/2017 14:52    Review of Systems  All other systems reviewed and are negative.   Blood pressure 110/62, pulse (!) 59, temperature 97.7 F (36.5 C), temperature source Oral, resp. rate 18, height 5' 4"  (1.626 m), weight 78 kg (172 lb), SpO2 99 %. Physical Exam  Vitals Andee Poles Gerrigner CMA; 08/08/2017 8:32 AM) 08/08/2017 8:32 AM Weight: 172.38 lb Height: 64in Body Surface Area: 1.84 m Body Mass Index: 29.59 kg/m  Temp.: 97.9F(Oral)  Pulse: 64 (Regular)  BP: 122/84 (Sitting, Left Arm, Standard) Physical Exam Rolm Bookbinder MD; 08/08/2017 12:12 PM) General Mental Status-Alert. Orientation-Oriented X3. Breast Note: no left breast hematoma cv rrr Lungs clear  Assessment/Plan BREAST NEOPLASM, TIS (DCIS), LEFT (D05.12) Story: genetics referral with new dx and three relatives with bca, left seed guided lumpectomy We discussed the staging and pathophysiology of breast cancer. We discussed all of the different options for treatment for breast cancer including surgery, chemotherapy, radiation therapy, Herceptin, and antiestrogen therapy. We discussed the options for treatment of the breast cancer which included lumpectomy versus a mastectomy. We discussed the performance of the lumpectomy with radioactive seed placement. We discussed a 5-10% chance of a positive margin requiring reexcision in the operating room. We also discussed that she will  likely need radiation therapy if she undergoes lumpectomy. We discussed the mastectomy (removal of whole breast) and the postoperative care for that as well. Mastectomy can be followed by reconstruction. This is a more extensive surgery and requires more recovery. The decision for lumpectomy vs mastectomy has no impact on decision for chemotherapy. Most mastectomy patients will not need radiation therapy. We discussed that there is no difference in her survival or really local recurrence. I think reasonable to do lumpectomy and await pathology. I dont think at this point she needs nac excised. I think much of those changes are chronic but if that margin is positive she will need nac excised also. she understands there is chance based on path that I might even recommend mastectomy later. I do not think she needs sn now. she understands if we find invasive disease on specimen that she may need to return for sn biopsy depending on what path says.  Rolm Bookbinder, MD 08/14/2017, 10:46 AM

## 2017-08-14 NOTE — Anesthesia Preprocedure Evaluation (Signed)
Anesthesia Evaluation  Patient identified by MRN, date of birth, ID band Patient awake    Reviewed: Allergy & Precautions, NPO status , Patient's Chart, lab work & pertinent test results  History of Anesthesia Complications (+) PONV  Airway Mallampati: II  TM Distance: >3 FB Neck ROM: Full    Dental no notable dental hx.    Pulmonary neg pulmonary ROS,    Pulmonary exam normal breath sounds clear to auscultation       Cardiovascular negative cardio ROS Normal cardiovascular exam Rhythm:Regular Rate:Normal     Neuro/Psych negative neurological ROS  negative psych ROS   GI/Hepatic Neg liver ROS, GERD  Medicated,  Endo/Other  negative endocrine ROS  Renal/GU Renal InsufficiencyRenal disease  negative genitourinary   Musculoskeletal negative musculoskeletal ROS (+)   Abdominal   Peds negative pediatric ROS (+)  Hematology negative hematology ROS (+)   Anesthesia Other Findings   Reproductive/Obstetrics negative OB ROS                             Anesthesia Physical Anesthesia Plan  ASA: II  Anesthesia Plan: General   Post-op Pain Management:    Induction: Intravenous  PONV Risk Score and Plan: 4 or greater and Ondansetron, Dexamethasone and Treatment may vary due to age or medical condition  Airway Management Planned: LMA  Additional Equipment:   Intra-op Plan:   Post-operative Plan: Extubation in OR  Informed Consent: I have reviewed the patients History and Physical, chart, labs and discussed the procedure including the risks, benefits and alternatives for the proposed anesthesia with the patient or authorized representative who has indicated his/her understanding and acceptance.   Dental advisory given  Plan Discussed with: CRNA and Surgeon  Anesthesia Plan Comments:         Anesthesia Quick Evaluation

## 2017-08-14 NOTE — Op Note (Signed)
Preoperative diagnosis: Left breast cancer, clinical stage 0 Postoperative diagnosis: same as above Procedure:Leftbreast seed guided lumpectomy Surgeon: Dr Serita Grammes BUL:[id number] Anes: general  Specimens: leftbreast tissue marked with paint  Complications none Drains none Sponge count correct Dispo to pacu stable  Indications: This is a 62 yof who had nipple dc. Initial imaging was negative but we got an mri that showed an area of nme. Biopsy was dcis.  We discussed options and elected to proceed with lumpectomy. She had radioactive seed placed prior to beginning and the mammograms were available for review.  Procedure: After informed consent was obtained the patient was taken to the operating room. She was given antibiotics. Sequential compression devices were on her legs. She was then placed under general anesthesia with an LMA. Then she was prepped and draped in the standard sterile surgical fashion. Surgical timeout was then performed.  I then located the seed in theleft upper outer quadrant.I infiltrated marcaine in the skin and then madean incision overlying the seed as it was very close to the skin by both the counts and the mammogram.I then used the neoprobe to remove the seed and the surrounding tissue with attempt to get clear margins.I marked this with paint. MM confirmed removal of seed and theclip.I then obtained hemostasis. This was marked as above.I closed with with 2-0 vicryl to approximate breast tissue. The skin was closed with 3-0 vicryl and 4-0 monocryl. Glue and steristrips were placed.

## 2017-08-15 ENCOUNTER — Encounter: Payer: Self-pay | Admitting: Radiation Oncology

## 2017-08-15 ENCOUNTER — Encounter (HOSPITAL_BASED_OUTPATIENT_CLINIC_OR_DEPARTMENT_OTHER): Payer: Self-pay | Admitting: General Surgery

## 2017-08-15 NOTE — Anesthesia Postprocedure Evaluation (Signed)
Anesthesia Post Note  Patient: Stacey Church  Procedure(s) Performed: BREAST LUMPECTOMY WITH RADIOACTIVE SEED LOCALIZATION (Left Breast)     Patient location during evaluation: PACU Anesthesia Type: General Level of consciousness: awake and alert Pain management: pain level controlled Vital Signs Assessment: post-procedure vital signs reviewed and stable Respiratory status: spontaneous breathing, nonlabored ventilation, respiratory function stable and patient connected to nasal cannula oxygen Cardiovascular status: blood pressure returned to baseline and stable Postop Assessment: no apparent nausea or vomiting Anesthetic complications: no    Last Vitals:  Vitals:   08/14/17 1215 08/14/17 1231  BP: 124/66 124/64  Pulse: 61 (!) 59  Resp: 16 18  Temp:  36.4 C  SpO2: 97% 99%    Last Pain:  Vitals:   08/14/17 1231  TempSrc:   PainSc: 0-No pain                 Kortlynn Poust S

## 2017-08-26 NOTE — Progress Notes (Signed)
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 - IN SITU CARCINOMA IS <0.1 CM OF THE POSTERIOR MARGIN BROADLY AND 0.1-0.2 CM OF THE LATERAL MARGIN MULTIFOCALLY. - LOBULAR NEOPLASIA (ATYPICAL LOBULAR HYPERPLASIA).  Receptor Status: ER(Negative), PR (Negative)  Did patient present with symptoms (if so, please note symptoms) or was this found on screening mammography?:  06/11/2017 Screening mammography showed C density breasts  Past/Anticipated interventions by surgeon, if any: 08/14/2017 Left breast lumpectomy with radioactive seed localization by Dr. Rolm Bookbinder  Past/Anticipated interventions by medical oncology, if any: Chemotherapy: None  Lymphedema issues, if any:  None   Pain issues, if any:  Discomfort towards the end of the day on the left lateral side of the breast  SAFETY ISSUES:  Prior radiation? No  Pacemaker/ICD? No  Possible current pregnancy? No  Is the patient on methotrexate? No  Current Complaints / other details:  Stacey Church presents today for a consult regarding her left breast cancer. Reports stable energy and no issues with range of motion since lumpectomy. Feels overwhelmed and unsure about current treatment plan but is willing to follow the course set out by her doctors    [name], RN 08/26/2017,2:04 PM

## 2017-08-28 ENCOUNTER — Ambulatory Visit
Admission: RE | Admit: 2017-08-28 | Discharge: 2017-08-28 | Disposition: A | Discharge status: Home / Self Care | Payer: Medicare Other | Source: Ambulatory Visit | Attending: Radiation Oncology | Admitting: Radiation Oncology

## 2017-08-28 ENCOUNTER — Other Ambulatory Visit: Payer: Self-pay

## 2017-08-28 VITALS — BP 142/72 | HR 59 | Temp 98.0°F | Resp 18 | Ht 64.0 in | Wt 173.8 lb

## 2017-08-28 VITALS — BP 142/72 | HR 59 | Temp 98.0°F | Resp 18 | Wt 173.5 lb

## 2017-08-28 DIAGNOSIS — D0512 Intraductal carcinoma in situ of left breast: Secondary | ICD-10-CM | POA: Insufficient documentation

## 2017-08-28 NOTE — Progress Notes (Signed)
Radiation Oncology         [phone] ________________________________  Initial outpatient Consultation  Name: Syana Degraffenreid MRN: [id number]  Date: 08/28/2017  DOB: 03-17-1946  QJ:[id number], Cherly Anderson, MD  Rolm Bookbinder, MD   REFERRING PHYSICIAN: Rolm Bookbinder, MD  DIAGNOSIS: Ductal carcinoma in situ of the left breast, high-grade, ER negative, PR negative  HISTORY OF PRESENT ILLNESS::Aruna Kivett is a 72 y.o. female who is seen out courtesy of Dr. Rolm Bookbinder for an opinion concerning radiation therapy as part of the management of the patient's recently diagnosed high-grade ductal carcinoma in situ involving  the left breast. The Patient presented with a 1 month history of intermittent bloody nipple discharge. She underwent mammographic imaging as well as ultrasound showing no mass within the left breast. Given the patient's history of bloody nipple discharge an MRI was recommended. This was performed on March 12 which revealed a 4.4 x 2 x 3.5 cm area scattered nodular enhancement within the central left breast.  also noted was some mild left nipple inversion/retraction which is been a chronic problem for the patient since she underwent a benign the left breast excision many years ago. She proceeded to undergo MR guided biopsy of the area of enhancement which revealed high-grade ductal carcinoma in situ, cribriform type with focal areas of necrosis and microcalcifications. Atypical lobular hyperplasia was also noted. The tumor was estrogen and progesterone receptor negative. Patient was subsequently referred to Dr. Donne Hazel and the patient proceeded to undergo left breast seed guided lumpectomy on 08/14/2017. Pathology from this surgery revealed high-grade ductal carcinoma in situ with calcifications spanning an area of approximately 0.9 cm. The in situ carcinoma was noted to be less than a millimeter broadly from the posterior margin,  and 1-2 mm from the lateral margin multifocally.  Patient has done well since her surgery. She now presents for radiation oncology for evaluation...  PREVIOUS RADIATION THERAPY: No  PAST MEDICAL HISTORY:  has a past medical history of Chronic back pain, Diverticular disease, GERD (gastroesophageal reflux disease), Glaucoma (increased eye pressure), Hyperglycemia, Oncocytoma, PONV (postoperative nausea and vomiting), and Seasonal allergies.    PAST SURGICAL HISTORY: Past Surgical History:  Procedure Laterality Date  . APPENDECTOMY    . BREAST CYST EXCISION Bilateral    benign  . BREAST LUMPECTOMY WITH RADIOACTIVE SEED LOCALIZATION Left 08/14/2017   Procedure: BREAST LUMPECTOMY WITH RADIOACTIVE SEED LOCALIZATION;  Surgeon: Rolm Bookbinder, MD;  Location: [facility];  Service: General;  Laterality: Left;  . CATARACT EXTRACTION Bilateral    lens inplants  . KIDNEY CYST REMOVAL    . VAGINAL HYSTERECTOMY     partial    FAMILY HISTORY: family history includes Breast cancer in her paternal aunt; Diabetes in her father; Skin cancer in her father. a total of 3 relatives with breast cancer  SOCIAL HISTORY:  reports that she has never smoked. She has never used smokeless tobacco. She reports that she does not drink alcohol or use drugs.  ALLERGIES: Bactrim [sulfamethoxazole-trimethoprim]; Macrobid [nitrofurantoin monohyd macro]; and Tylenol [acetaminophen]  MEDICATIONS:  Current Outpatient Medications  Medication Sig Dispense Refill  . Calcium Carbonate-Vitamin D (CALTRATE 600+D) 600-400 MG-UNIT per tablet Take 1 tablet by mouth 2 (two) times daily.    . cholecalciferol (VITAMIN D) 1000 UNITS tablet Take 1,000 Units by mouth daily.    . Cyanocobalamin (VITAMIN B 12 PO) Take 1 tablet by mouth every morning.    . folic acid (FOLVITE) 448 MCG tablet Take 400 mcg by mouth daily.    Marland Kitchen  ibuprofen (ADVIL,MOTRIN) 200 MG tablet Take 400 mg by mouth daily as needed. For pain    . lansoprazole (PREVACID) 15 MG capsule Take 15 mg by mouth  daily at 12 noon.    . timolol (TIMOPTIC) 0.5 % ophthalmic solution Place 1 drop into both eyes daily.    . vitamin E 400 UNIT capsule Take 400 Units by mouth daily.     No current facility-administered medications for this encounter.     REVIEW OF SYSTEMS:  REVIEW OF SYSTEMS: A 10+ POINT REVIEW OF SYSTEMS WAS OBTAINED including neurology, dermatology, psychiatry, cardiac, respiratory, lymph, extremities, GI, GU, musculoskeletal, constitutional, reproductive, HEENT. All pertinent positives are noted in the HPI. All others are negative.   PHYSICAL EXAM: Vitals - 1 value per visit 10/02/7844  SYSTOLIC 962  DIASTOLIC 72  Pulse 59  Temperature 98  Respirations 18  Weight (lb) 173.5  Height   BMI 29.78  VISIT REPORT    General: Alert and oriented, in no acute distress HEENT: Head is normocephalic. Extraocular movements are intact. Oropharynx is clear. Neck: Neck is supple, no palpable cervical or supraclavicular lymphadenopathy. Heart: Regular in rate and rhythm with no murmurs, rubs, or gallops. Chest: Clear to auscultation bilaterally, with no rhonchi, wheezes, or rales. Abdomen: Soft, nontender, nondistended, with no rigidity or guarding. Extremities: No cyanosis or edema. Lymphatics: see Neck Exam Skin: No concerning lesions. Musculoskeletal: symmetric strength and muscle tone throughout. Neurologic: Cranial nerves II through XII are grossly intact. No obvious focalities. Speech is fluent. Coordination is intact. Psychiatric: Judgment and insight are intact. Affect is appropriate. Right breast: No palpable mass nipple discharge or bleeding Left breast: The patient has a scar in the upper outer quadrant with Steri-Strips in place.. This is healing well without signs of drainage or infection. Some bruising noted in the left breast region. The left nipple is inverted (chronic problem). She also has a faint scar in the upper breast from prior excision many years ago.    ECOG =  0    LABORATORY DATA:  Lab Results  Component Value Date   WBC 5.8 12/02/2011   HGB 13.1 12/02/2011   HCT 38.7 12/02/2011   MCV 91.1 12/02/2011   PLT 87 (L) 12/02/2011   No results found for: NA, K, CL, CO2, GLUCOSE, CREATININE, CALCIUM    RADIOGRAPHY: Mm Breast Surgical Specimen  Result Date: 08/14/2017 CLINICAL DATA:  Post left lumpectomy for high-grade DCIS. EXAM: SPECIMEN RADIOGRAPH OF THE LEFT BREAST ULTRASOUND GUIDED RADIOACTIVE SEED LOCALIZATION OF THE LEFT BREAST COMPARISON:  Previous exam(s). FINDINGS: Status post excision of the left breast. The radioactive seed and biopsy marker clip are present, completely intact, and were marked for pathology. IMPRESSION: Specimen radiograph of the left breast. Electronically Signed   By: Fidela Salisbury M.D.   On: 08/14/2017 11:42   Mm Lt Radioactive Seed Loc Mammo Guide  Result Date: 08/13/2017 CLINICAL DATA:  Patient presents for seed localization prior to lumpectomy for LEFT breast DCIS. EXAM: MAMMOGRAPHIC GUIDED RADIOACTIVE SEED LOCALIZATION OF THE LEFT BREAST COMPARISON:  08/05/2017 and earlier FINDINGS: Patient presents for radioactive seed localization prior to lumpectomy. I met with the patient and we discussed the procedure of seed localization including benefits and alternatives. We discussed the high likelihood of a successful procedure. We discussed the risks of the procedure including infection, bleeding, tissue injury and further surgery. We discussed the low dose of radioactivity involved in the procedure. Informed, written consent was given. The usual time-out protocol was performed immediately prior  to the procedure. Using mammographic guidance, sterile technique, 1% lidocaine and an I-125 radioactive seed, the dumbbell-shaped clip was localized using a LATERAL approach. The follow-up mammogram images confirm the seed in the expected location and were marked for Dr. Donne Hazel. Follow-up survey of the patient confirms presence  of the radioactive seed. Order number of I-125 seed:  [id number]. Total activity:  5.284 millicuries reference Date: 08/05/2017 The patient tolerated the procedure well and was released from the [facility]. She was given instructions regarding seed removal. IMPRESSION: Radioactive seed localization LEFT breast. No apparent complications. Electronically Signed   By: Nolon Nations M.D.   On: 08/13/2017 14:52   Mm Clip Placement Left  Result Date: 08/05/2017 CLINICAL DATA:  Status post MR guided core biopsy of abnormal enhancement in the left breast. EXAM: DIAGNOSTIC LEFT MAMMOGRAM POST MRI BIOPSY COMPARISON:  Previous exam(s). FINDINGS: Mammographic images were obtained following MR guided biopsy of abnormal enhancement in the upper-outer quadrant of the left breast. Mammographic images show there is a dumbbell-shaped clip in the upper-outer quadrant of the left breast in appropriate position. IMPRESSION: Status post MR guided core biopsy of the left breast with pathology pending. Final Assessment: Post Procedure Mammograms for Marker Placement Electronically Signed   By: Lillia Mountain M.D.   On: 08/05/2017 08:49   Mr Aundra Millet Breast Bx Johnella Moloney Dev 1st Lesion Image Bx Spec Mr Guide  Addendum Date: 08/07/2017   ADDENDUM REPORT: 08/07/2017 15:30 ADDENDUM: Pathology revealed HIGH GRADE DUCTAL CARCINOMA IN SITU (DCIS) WITH FOCAL NECROSIS AND MICROCALCIFICATIONS, ATYPICAL LOBULAR HYPERPLASIA (ALH) of the Left breast, superior. This was found to be concordant by Dr. Lillia Mountain. Pathology results were discussed with the patient by telephone. The patient reported doing well after the biopsy with tenderness at the site. Post biopsy instructions and care were reviewed and questions were answered. The patient was encouraged to call [facility] for any additional concerns. Surgical consultation has been arranged with Dr. Rolm Bookbinder, per patient request at [facility] on August 08, 2017. Pathology results reported by Terie Purser, RN on 08/07/2017. Electronically Signed   By: Lillia Mountain M.D.   On: 08/07/2017 15:30   Result Date: 08/07/2017 CLINICAL DATA:  72 year old female with bloody left nipple discharge. Abnormal enhancement seen in the superior left breast with MR imaging. EXAM: MRI GUIDED CORE NEEDLE BIOPSY OF THE LEFT BREAST TECHNIQUE: Multiplanar, multisequence MR imaging of the left breast was performed both before and after administration of intravenous contrast. CONTRAST:  68m MULTIHANCE GADOBENATE DIMEGLUMINE 529 MG/ML IV SOLN COMPARISON:  Previous exams. FINDINGS: I met with the patient, and we discussed the procedure of MRI guided biopsy, including risks, benefits, and alternatives. Specifically, we discussed the risks of infection, bleeding, tissue injury, clip migration, and inadequate sampling. Informed, written consent was given. The usual time out protocol was performed immediately prior to the procedure. Using sterile technique, 1% lidocaine and 1% lidocaine with epinephrine, MRI guidance, and a 9 gauge vacuum assisted device, biopsy was performed of the enhancement in the upper outer quadrant of the left breast using a lateral to medial approach. At the conclusion of the procedure, a dumbbell-shaped tissue marker clip was deployed into the biopsy cavity. Follow-up 2-view mammogram was performed and dictated separately. IMPRESSION: MRI guided biopsy of the left breast.  No apparent complications. Electronically Signed: By: DLillia MountainM.D. On: 08/05/2017 08:54      IMPRESSION: high-grade ductal carcinoma in situ of the left breast, ER  negative, PR negative. She would be a good candidate for breast conservation therapy with radiation therapy directed at the left breast. Given the high-grade nature of the lesion and the fact that this tumor is ER and PR negative would recommend radiation therapy as part of  her overall management. We discussed the close surgical margins  and I also spoke with Dr. Donne Hazel concerning this issue. Options at this point would be to proceed with re-excision or to go ahead with radiation therapy including whole breast   followed by a boost to the lumpectomy cavity given the close surgical margin. The patient would appear to be a good candidate for hypofractionated accelerated treatment which would be helpful given her long distance from a treatment facility. I also mentioned mastectomy as a potential management, the advantage of this approach being avoidance of radiation treatment. The patient does not wish to consider mastectomy as part of her management. She at this point is leaning towards re-excision followed by radiation treatment but will meet with Dr. Donne Hazel later this week for further discussion of this issue.  PLAN: Treatment plan is pending further discussion with Dr. Donne Hazel.  She has also been set up for genetics evaluation later this month given her family history of breast cancer.     ------------------------------------------------  Blair Promise, PhD, MD

## 2017-08-30 ENCOUNTER — Other Ambulatory Visit: Payer: Self-pay

## 2017-08-30 ENCOUNTER — Encounter (HOSPITAL_BASED_OUTPATIENT_CLINIC_OR_DEPARTMENT_OTHER): Payer: Self-pay | Admitting: *Deleted

## 2017-08-30 ENCOUNTER — Encounter: Payer: Self-pay | Admitting: Radiation Oncology

## 2017-08-30 ENCOUNTER — Other Ambulatory Visit: Payer: Self-pay | Admitting: General Surgery

## 2017-08-30 NOTE — Progress Notes (Signed)
Patient given Ensure pre surgery with instruction to drink by 1030, pt voiced understanding with questions.

## 2017-09-03 ENCOUNTER — Ambulatory Visit (HOSPITAL_BASED_OUTPATIENT_CLINIC_OR_DEPARTMENT_OTHER): Payer: Medicare Other | Admitting: Anesthesiology

## 2017-09-03 ENCOUNTER — Ambulatory Visit (HOSPITAL_BASED_OUTPATIENT_CLINIC_OR_DEPARTMENT_OTHER)
Admission: RE | Admit: 2017-09-03 | Discharge: 2017-09-03 | Disposition: A | Discharge status: Home / Self Care | Payer: Medicare Other | Source: Ambulatory Visit | Attending: General Surgery | Admitting: General Surgery

## 2017-09-03 ENCOUNTER — Encounter (HOSPITAL_BASED_OUTPATIENT_CLINIC_OR_DEPARTMENT_OTHER): Payer: Self-pay | Admitting: *Deleted

## 2017-09-03 ENCOUNTER — Encounter (HOSPITAL_BASED_OUTPATIENT_CLINIC_OR_DEPARTMENT_OTHER)
Admission: RE | Disposition: A | Discharge status: Home / Self Care | Payer: Self-pay | Source: Ambulatory Visit | Attending: General Surgery

## 2017-09-03 ENCOUNTER — Other Ambulatory Visit: Payer: Self-pay

## 2017-09-03 DIAGNOSIS — Z803 Family history of malignant neoplasm of breast: Secondary | ICD-10-CM | POA: Insufficient documentation

## 2017-09-03 DIAGNOSIS — N183 Chronic kidney disease, stage 3 (moderate): Secondary | ICD-10-CM | POA: Diagnosis not present

## 2017-09-03 DIAGNOSIS — K219 Gastro-esophageal reflux disease without esophagitis: Secondary | ICD-10-CM | POA: Insufficient documentation

## 2017-09-03 DIAGNOSIS — D0592 Unspecified type of carcinoma in situ of left breast: Secondary | ICD-10-CM | POA: Insufficient documentation

## 2017-09-03 DIAGNOSIS — H409 Unspecified glaucoma: Secondary | ICD-10-CM | POA: Diagnosis not present

## 2017-09-03 DIAGNOSIS — M549 Dorsalgia, unspecified: Secondary | ICD-10-CM | POA: Insufficient documentation

## 2017-09-03 DIAGNOSIS — G8929 Other chronic pain: Secondary | ICD-10-CM | POA: Diagnosis not present

## 2017-09-03 DIAGNOSIS — Z79899 Other long term (current) drug therapy: Secondary | ICD-10-CM | POA: Insufficient documentation

## 2017-09-03 DIAGNOSIS — Z791 Long term (current) use of non-steroidal anti-inflammatories (NSAID): Secondary | ICD-10-CM | POA: Insufficient documentation

## 2017-09-03 HISTORY — PX: RE-EXCISION OF BREAST LUMPECTOMY: SHX6048

## 2017-09-03 HISTORY — DX: Malignant (primary) neoplasm, unspecified: C80.1

## 2017-09-03 SURGERY — EXCISION, LESION, BREAST
Anesthesia: General | Site: Breast | Laterality: Left

## 2017-09-03 MED ORDER — CEFAZOLIN SODIUM-DEXTROSE 2-4 GM/100ML-% IV SOLN
2.0000 g | INTRAVENOUS | Status: AC
  Administered 2017-09-03: 2 g via INTRAVENOUS

## 2017-09-03 MED ORDER — DEXAMETHASONE SODIUM PHOSPHATE 10 MG/ML IJ SOLN
INTRAMUSCULAR | Status: AC
  Filled 2017-09-03: qty 1

## 2017-09-03 MED ORDER — OXYCODONE HCL 5 MG PO TABS
5.0000 mg | ORAL_TABLET | Freq: Once | ORAL | Status: AC | PRN
  Administered 2017-09-03: 5 mg via ORAL

## 2017-09-03 MED ORDER — MEPERIDINE HCL 25 MG/ML IJ SOLN: 6.2500 mg | INTRAMUSCULAR | Status: DC | PRN

## 2017-09-03 MED ORDER — DEXAMETHASONE SODIUM PHOSPHATE 4 MG/ML IJ SOLN
INTRAMUSCULAR | Status: DC | PRN
  Administered 2017-09-03: 10 mg via INTRAVENOUS

## 2017-09-03 MED ORDER — CEFAZOLIN SODIUM-DEXTROSE 2-4 GM/100ML-% IV SOLN
INTRAVENOUS | Status: AC
  Filled 2017-09-03: qty 100

## 2017-09-03 MED ORDER — FENTANYL CITRATE (PF) 100 MCG/2ML IJ SOLN
25.0000 ug | INTRAMUSCULAR | Status: DC | PRN
  Administered 2017-09-03: 50 ug via INTRAVENOUS

## 2017-09-03 MED ORDER — GABAPENTIN 300 MG PO CAPS
ORAL_CAPSULE | ORAL | Status: AC
  Filled 2017-09-03: qty 1

## 2017-09-03 MED ORDER — FENTANYL CITRATE (PF) 100 MCG/2ML IJ SOLN
INTRAMUSCULAR | Status: AC
  Filled 2017-09-03: qty 2

## 2017-09-03 MED ORDER — LIDOCAINE HCL (CARDIAC) PF 100 MG/5ML IV SOSY
PREFILLED_SYRINGE | INTRAVENOUS | Status: DC | PRN
  Administered 2017-09-03: 40 mg via INTRAVENOUS

## 2017-09-03 MED ORDER — PROMETHAZINE HCL 25 MG/ML IJ SOLN: 6.2500 mg | INTRAMUSCULAR | Status: DC | PRN

## 2017-09-03 MED ORDER — ONDANSETRON HCL 4 MG/2ML IJ SOLN
INTRAMUSCULAR | Status: AC
  Filled 2017-09-03: qty 2

## 2017-09-03 MED ORDER — OXYCODONE HCL 5 MG/5ML PO SOLN: 5.0000 mg | Freq: Once | ORAL | Status: AC | PRN

## 2017-09-03 MED ORDER — LACTATED RINGERS IV SOLN
INTRAVENOUS | Status: DC
  Administered 2017-09-03: 14:00:00 via INTRAVENOUS

## 2017-09-03 MED ORDER — CELECOXIB 200 MG PO CAPS
ORAL_CAPSULE | ORAL | Status: AC
  Filled 2017-09-03: qty 1

## 2017-09-03 MED ORDER — BUPIVACAINE HCL (PF) 0.25 % IJ SOLN
INTRAMUSCULAR | Status: DC | PRN
  Administered 2017-09-03: 10 mL

## 2017-09-03 MED ORDER — SCOPOLAMINE 1 MG/3DAYS TD PT72: 1.0000 | MEDICATED_PATCH | Freq: Once | TRANSDERMAL | Status: DC | PRN

## 2017-09-03 MED ORDER — PROPOFOL 10 MG/ML IV BOLUS
INTRAVENOUS | Status: AC
  Filled 2017-09-03: qty 20

## 2017-09-03 MED ORDER — CELECOXIB 200 MG PO CAPS: 200.0000 mg | ORAL_CAPSULE | Freq: Once | ORAL | Status: DC

## 2017-09-03 MED ORDER — FENTANYL CITRATE (PF) 100 MCG/2ML IJ SOLN
50.0000 ug | INTRAMUSCULAR | Status: DC | PRN
  Administered 2017-09-03: 100 ug via INTRAVENOUS

## 2017-09-03 MED ORDER — PROPOFOL 10 MG/ML IV BOLUS
INTRAVENOUS | Status: DC | PRN
  Administered 2017-09-03: 120 mg via INTRAVENOUS

## 2017-09-03 MED ORDER — GABAPENTIN 300 MG PO CAPS
300.0000 mg | ORAL_CAPSULE | Freq: Once | ORAL | Status: AC
  Administered 2017-09-03: 300 mg via ORAL

## 2017-09-03 MED ORDER — MIDAZOLAM HCL 2 MG/2ML IJ SOLN: 1.0000 mg | INTRAMUSCULAR | Status: DC | PRN

## 2017-09-03 MED ORDER — ONDANSETRON HCL 4 MG/2ML IJ SOLN
INTRAMUSCULAR | Status: DC | PRN
  Administered 2017-09-03: 4 mg via INTRAVENOUS

## 2017-09-03 MED ORDER — OXYCODONE HCL 5 MG PO TABS
ORAL_TABLET | ORAL | Status: AC
  Filled 2017-09-03: qty 1

## 2017-09-03 SURGICAL SUPPLY — 52 items
BINDER BREAST LRG (GAUZE/BANDAGES/DRESSINGS) IMPLANT
BINDER BREAST MEDIUM (GAUZE/BANDAGES/DRESSINGS) IMPLANT
BINDER BREAST XLRG (GAUZE/BANDAGES/DRESSINGS) IMPLANT
BINDER BREAST XXLRG (GAUZE/BANDAGES/DRESSINGS) IMPLANT
BLADE SURG 15 STRL LF DISP TIS (BLADE) ×1 IMPLANT
BLADE SURG 15 STRL SS (BLADE) ×1
CANISTER SUCT 1200ML W/VALVE (MISCELLANEOUS) ×2 IMPLANT
CHLORAPREP W/TINT 26ML (MISCELLANEOUS) ×2 IMPLANT
CLIP VESOCCLUDE SM WIDE 6/CT (CLIP) IMPLANT
COVER BACK TABLE 60X90IN (DRAPES) ×2 IMPLANT
COVER MAYO STAND STRL (DRAPES) ×2 IMPLANT
DECANTER SPIKE VIAL GLASS SM (MISCELLANEOUS) IMPLANT
DERMABOND ADVANCED (GAUZE/BANDAGES/DRESSINGS)
DERMABOND ADVANCED .7 DNX12 (GAUZE/BANDAGES/DRESSINGS) IMPLANT
DRAPE LAPAROSCOPIC ABDOMINAL (DRAPES) ×2 IMPLANT
DRAPE UTILITY XL STRL (DRAPES) ×2 IMPLANT
DRSG TEGADERM 4X4.75 (GAUZE/BANDAGES/DRESSINGS) ×2 IMPLANT
ELECT COATED BLADE 2.86 ST (ELECTRODE) ×2 IMPLANT
ELECT REM PT RETURN 9FT ADLT (ELECTROSURGICAL) ×2
ELECTRODE REM PT RTRN 9FT ADLT (ELECTROSURGICAL) ×1 IMPLANT
GAUZE SPONGE 4X4 12PLY STRL LF (GAUZE/BANDAGES/DRESSINGS) ×2 IMPLANT
GLOVE BIO SURGEON STRL SZ7 (GLOVE) ×2 IMPLANT
GLOVE BIOGEL PI IND STRL 7.5 (GLOVE) ×1 IMPLANT
GLOVE BIOGEL PI INDICATOR 7.5 (GLOVE) ×1
GOWN STRL REUS W/ TWL LRG LVL3 (GOWN DISPOSABLE) ×3 IMPLANT
GOWN STRL REUS W/TWL LRG LVL3 (GOWN DISPOSABLE) ×3
HEMOSTAT ARISTA ABSORB 3G PWDR (MISCELLANEOUS) IMPLANT
ILLUMINATOR WAVEGUIDE N/F (MISCELLANEOUS) IMPLANT
KIT MARKER MARGIN INK (KITS) IMPLANT
LIGHT WAVEGUIDE WIDE FLAT (MISCELLANEOUS) IMPLANT
NEEDLE HYPO 25X1 1.5 SAFETY (NEEDLE) ×2 IMPLANT
NS IRRIG 1000ML POUR BTL (IV SOLUTION) IMPLANT
PACK BASIN DAY SURGERY FS (CUSTOM PROCEDURE TRAY) ×2 IMPLANT
PENCIL BUTTON HOLSTER BLD 10FT (ELECTRODE) ×2 IMPLANT
SLEEVE SCD COMPRESS KNEE MED (MISCELLANEOUS) ×2 IMPLANT
SPONGE LAP 4X18 X RAY DECT (DISPOSABLE) ×2 IMPLANT
STRIP CLOSURE SKIN 1/2X4 (GAUZE/BANDAGES/DRESSINGS) IMPLANT
SUT MNCRL AB 3-0 PS2 18 (SUTURE) IMPLANT
SUT MNCRL AB 4-0 PS2 18 (SUTURE) IMPLANT
SUT MON AB 5-0 PS2 18 (SUTURE) IMPLANT
SUT SILK 2 0 SH (SUTURE) IMPLANT
SUT VIC AB 2-0 SH 27 (SUTURE) ×1
SUT VIC AB 2-0 SH 27XBRD (SUTURE) ×1 IMPLANT
SUT VIC AB 3-0 SH 27 (SUTURE) ×1
SUT VIC AB 3-0 SH 27X BRD (SUTURE) ×1 IMPLANT
SUT VIC AB 5-0 PS2 18 (SUTURE) IMPLANT
SUT VICRYL AB 3 0 TIES (SUTURE) IMPLANT
SYR CONTROL 10ML LL (SYRINGE) ×2 IMPLANT
TOWEL OR 17X24 6PK STRL BLUE (TOWEL DISPOSABLE) ×2 IMPLANT
TOWEL OR NON WOVEN STRL DISP B (DISPOSABLE) ×2 IMPLANT
TUBE CONNECTING 20X1/4 (TUBING) ×2 IMPLANT
YANKAUER SUCT BULB TIP NO VENT (SUCTIONS) ×2 IMPLANT

## 2017-09-03 NOTE — Op Note (Signed)
Preoperative diagnosis: Left breast dcis with positive margins after lumpectomy Postoperative diagnosis: same as above Procedure:Leftbreast re-excision posterior and lateral margins Surgeon: Dr Serita Grammes KPT:[id number] Anes: general  Specimens: leftbreast posterior and lateral margins both marked short superior, long lateral and double deep Complications none Drains none Sponge count correct Dispo to pacu stable  Indications: This is a 15 yof who had nipple dc. Initial imaging was negative but we got an mri that showed an area of nme. Biopsy was dcis.  We discussed options and elected to proceed with lumpectomy. The posterior margin is very close and the lateral margin is 1-2 mm.  We discussed options and elected to excise these both.   Procedure: After informed consent was obtained the patient was taken to the operating room. She was given antibiotics. Sequential compression devices were on her legs. She was then placed under general anesthesia with an LMA. Then she was prepped and draped in the standard sterile surgical fashion. Surgical timeout was then performed.  I infitrated marcaine near the prior incision. I then reentered the prior incision. I freed the sutures holding the cavity together.  I then removed additional lateral margin and marked as above. I then removed the posterior margin to the muscle and marked as above. I then obtained hemostasis. This was marked as above.I closed with with 2-0 vicryl to approximate breast tissue. The skin was closed with 3-0 vicryl and 4-0 monocryl. Glue and steristrips were placed.

## 2017-09-03 NOTE — Anesthesia Procedure Notes (Signed)
Procedure Name: LMA Insertion Date/Time: 09/03/2017 2:05 PM Performed by: Maryella Shivers, CRNA Pre-anesthesia Checklist: Patient identified, Emergency Drugs available, Suction available and Patient being monitored Patient Re-evaluated:Patient Re-evaluated prior to induction Oxygen Delivery Method: Circle system utilized Preoxygenation: Pre-oxygenation with 100% oxygen Induction Type: IV induction Ventilation: Mask ventilation without difficulty LMA: LMA inserted LMA Size: 4.0 Number of attempts: 1 Airway Equipment and Method: Bite block Placement Confirmation: positive ETCO2 Tube secured with: Tape Dental Injury: Teeth and Oropharynx as per pre-operative assessment

## 2017-09-03 NOTE — Anesthesia Preprocedure Evaluation (Signed)
Anesthesia Evaluation  Patient identified by MRN, date of birth, ID band Patient awake    Reviewed: Allergy & Precautions, NPO status , Patient's Chart, lab work & pertinent test results  History of Anesthesia Complications (+) PONV and history of anesthetic complications  Airway Mallampati: II  TM Distance: >3 FB Neck ROM: Full    Dental no notable dental hx. (+) Teeth Intact   Pulmonary neg pulmonary ROS,    Pulmonary exam normal breath sounds clear to auscultation       Cardiovascular negative cardio ROS Normal cardiovascular exam Rhythm:Regular Rate:Normal     Neuro/Psych negative neurological ROS  negative psych ROS   GI/Hepatic Neg liver ROS, GERD  Medicated and Controlled,  Endo/Other  Left Breast Ca  Renal/GU Renal InsufficiencyRenal disease  negative genitourinary   Musculoskeletal negative musculoskeletal ROS (+)   Abdominal   Peds negative pediatric ROS (+)  Hematology negative hematology ROS (+)   Anesthesia Other Findings   Reproductive/Obstetrics negative OB ROS                             Anesthesia Physical  Anesthesia Plan  ASA: II  Anesthesia Plan: General   Post-op Pain Management:    Induction: Intravenous  PONV Risk Score and Plan: 4 or greater and Ondansetron, Dexamethasone and Treatment may vary due to age or medical condition  Airway Management Planned: LMA  Additional Equipment:   Intra-op Plan:   Post-operative Plan: Extubation in OR  Informed Consent: I have reviewed the patients History and Physical, chart, labs and discussed the procedure including the risks, benefits and alternatives for the proposed anesthesia with the patient or authorized representative who has indicated his/her understanding and acceptance.   Dental advisory given  Plan Discussed with: CRNA, Surgeon and Anesthesiologist  Anesthesia Plan Comments:          Anesthesia Quick Evaluation

## 2017-09-03 NOTE — Discharge Instructions (Signed)
[facility] °Office Phone Number [phone] ° °BREAST BIOPSY/ PARTIAL MASTECTOMY: POST OP INSTRUCTIONS ° °Always review your discharge instruction sheet given to you by the facility where your surgery was performed. ° °IF YOU HAVE DISABILITY OR FAMILY LEAVE FORMS, YOU MUST BRING THEM TO THE OFFICE FOR PROCESSING.  DO NOT GIVE THEM TO YOUR DOCTOR. ° °1. A prescription for pain medication may be given to you upon discharge.  Take your pain medication as prescribed, if needed.  If narcotic pain medicine is not needed, then you may take acetaminophen (Tylenol), naprosyn (Alleve) or ibuprofen (Advil) as needed. °2. Take your usually prescribed medications unless otherwise directed °3. If you need a refill on your pain medication, please contact your pharmacy.  They will contact our office to request authorization.  Prescriptions will not be filled after 5pm or on week-ends. °4. You should eat very light the first 24 hours after surgery, such as soup, crackers, pudding, etc.  Resume your normal diet the day after surgery. °5. Most patients will experience some swelling and bruising in the breast.  Ice packs and a good support bra will help.  Wear the breast binder provided or a sports bra for 72 hours day and night.  After that wear a sports bra during the day until you return to the office. Swelling and bruising can take several days to resolve.  °6. It is common to experience some constipation if taking pain medication after surgery.  Increasing fluid intake and taking a stool softener will usually help or prevent this problem from occurring.  A mild laxative (Milk of Magnesia or Miralax) should be taken according to package directions if there are no bowel movements after 48 hours. °7. Unless discharge instructions indicate otherwise, you may remove your bandages 48 hours after surgery and you may shower at that time.  You may have steri-strips (small skin tapes) in place directly over the incision.   These strips should be left on the skin for 7-10 days and will come off on their own.  If your surgeon used skin glue on the incision, you may shower in 24 hours.  The glue will flake off over the next 2-3 weeks.  Any sutures or staples will be removed at the office during your follow-up visit. °8. ACTIVITIES:  You may resume regular daily activities (gradually increasing) beginning the next day.  Wearing a good support bra or sports bra minimizes pain and swelling.  You may have sexual intercourse when it is comfortable. °a. You may drive when you no longer are taking prescription pain medication, you can comfortably wear a seatbelt, and you can safely maneuver your car and apply brakes. °b. RETURN TO WORK:  ______________________________________________________________________________________ °9. You should see your doctor in the office for a follow-up appointment approximately two weeks after your surgery.  Your doctor’s nurse will typically make your follow-up appointment when she calls you with your pathology report.  Expect your pathology report 3-4 business days after your surgery.  You may call to check if you do not hear from us after three days. °10. OTHER INSTRUCTIONS: _______________________________________________________________________________________________ _____________________________________________________________________________________________________________________________________ °_____________________________________________________________________________________________________________________________________ °_____________________________________________________________________________________________________________________________________ ° °WHEN TO CALL DR Jayna Mulnix: °1. Fever over 101.0 °2. Nausea and/or vomiting. °3. Extreme swelling or bruising. °4. Continued bleeding from incision. °5. Increased pain, redness, or drainage from the incision. ° °The clinic staff is available to  answer your questions during regular business hours.  Please don’t hesitate to call and ask to speak to one of the nurses for   clinical concerns.  If you have a medical emergency, go to the nearest emergency room or call 911.  A surgeon from [facility] is always on call at the hospital.  For further questions, please visit [email or web address]  Post Anesthesia Home Care Instructions  Activity: Get plenty of rest for the remainder of the day. A responsible individual must stay with you for 24 hours following the procedure.  For the next 24 hours, DO NOT: -Drive a car -Paediatric nurse -Drink alcoholic beverages -Take any medication unless instructed by your physician -Make any legal decisions or sign important papers.  Meals: Start with liquid foods such as gelatin or soup. Progress to regular foods as tolerated. Avoid greasy, spicy, heavy foods. If nausea and/or vomiting occur, drink only clear liquids until the nausea and/or vomiting subsides. Call your physician if vomiting continues.  Special Instructions/Symptoms: Your throat may feel dry or sore from the anesthesia or the breathing tube placed in your throat during surgery. If this causes discomfort, gargle with warm salt water. The discomfort should disappear within 24 hours.  If you had a scopolamine patch placed behind your ear for the management of post- operative nausea and/or vomiting:  1. The medication in the patch is effective for 72 hours, after which it should be removed.  Wrap patch in a tissue and discard in the trash. Wash hands thoroughly with soap and water. 2. You may remove the patch earlier than 72 hours if you experience unpleasant side effects which may include dry mouth, dizziness or visual disturbances. 3. Avoid touching the patch. Wash your hands with soap and water after contact with the patch.

## 2017-09-03 NOTE — Transfer of Care (Signed)
Immediate Anesthesia Transfer of Care Note  Patient: Stacey Church  Procedure(s) Performed: RE-EXCISION LEFT BREAST MARGIN (Left Breast)  Patient Location: PACU  Anesthesia Type:General  Level of Consciousness: awake, alert  and oriented  Airway & Oxygen Therapy: Patient Spontanous Breathing and Patient connected to face mask oxygen  Post-op Assessment: Report given to RN and Post -op Vital signs reviewed and stable  Post vital signs: Reviewed and stable  Last Vitals:  Vitals Value Taken Time  BP 120/69 09/03/2017  2:43 PM  Temp    Pulse 61 09/03/2017  2:44 PM  Resp 12 09/03/2017  2:44 PM  SpO2 99 % 09/03/2017  2:44 PM  Vitals shown include unvalidated device data.  Last Pain:  Vitals:   09/03/17 1238  TempSrc: Oral  PainSc: 0-No pain      Patients Stated Pain Goal: 3 (28/00/34 9179)  Complications: No apparent anesthesia complications

## 2017-09-03 NOTE — Interval H&P Note (Signed)
History and Physical Interval Note:  09/03/2017 1:28 PM  Stacey Church  has presented today for surgery, with the diagnosis of LEFT BREAST DUCTAL CARCINOMA IN SITU  The various methods of treatment have been discussed with the patient and family. After consideration of risks, benefits and other options for treatment, the patient has consented to  Procedure(s): RE-EXCISION LEFT BREAST MARGIN (Left) as a surgical intervention .  The patient's history has been reviewed, patient examined, no change in status, stable for surgery.  I have reviewed the patient's chart and labs.  Questions were answered to the patient's satisfaction.     Rolm Bookbinder

## 2017-09-03 NOTE — Anesthesia Postprocedure Evaluation (Signed)
Anesthesia Post Note  Patient: Temprence Rhines  Procedure(s) Performed: RE-EXCISION LEFT BREAST MARGIN (Left Breast)     Patient location during evaluation: PACU Anesthesia Type: General Level of consciousness: awake and alert and oriented Pain management: pain level controlled Vital Signs Assessment: post-procedure vital signs reviewed and stable Respiratory status: spontaneous breathing, nonlabored ventilation and respiratory function stable Cardiovascular status: blood pressure returned to baseline and stable Postop Assessment: no apparent nausea or vomiting Anesthetic complications: no    Last Vitals:  Vitals:   09/03/17 1430 09/03/17 1445  BP: 120/69 109/73  Pulse: (!) 55 61  Resp: 11 12  Temp: (!) 36.4 C   SpO2: 99% 99%    Last Pain:  Vitals:   09/03/17 1430  TempSrc:   PainSc: Asleep                 Egor Fullilove A.

## 2017-09-04 ENCOUNTER — Encounter (HOSPITAL_BASED_OUTPATIENT_CLINIC_OR_DEPARTMENT_OTHER): Payer: Self-pay | Admitting: General Surgery

## 2017-09-18 ENCOUNTER — Other Ambulatory Visit: Payer: Self-pay | Admitting: Oncology

## 2017-09-24 ENCOUNTER — Encounter: Payer: Self-pay | Admitting: Genetics

## 2017-09-24 ENCOUNTER — Inpatient Hospital Stay: Payer: Medicare Other | Attending: Genetic Counselor | Admitting: Genetics

## 2017-09-24 ENCOUNTER — Inpatient Hospital Stay: Payer: Medicare Other

## 2017-09-24 DIAGNOSIS — Z808 Family history of malignant neoplasm of other organs or systems: Secondary | ICD-10-CM

## 2017-09-24 DIAGNOSIS — Z803 Family history of malignant neoplasm of breast: Secondary | ICD-10-CM

## 2017-09-24 DIAGNOSIS — Z1379 Encounter for other screening for genetic and chromosomal anomalies: Secondary | ICD-10-CM

## 2017-09-24 DIAGNOSIS — D0512 Intraductal carcinoma in situ of left breast: Secondary | ICD-10-CM

## 2017-09-24 NOTE — Progress Notes (Addendum)
REFERRING PROVIDER: Rolm Bookbinder, MD [place],  [place] [id number]  PRIMARY PROVIDER:  Pomposini, Cherly Anderson, MD  PRIMARY REASON FOR VISIT:  1. Ductal carcinoma in situ (DCIS) of left breast   2. Family history of breast cancer   3. Family history of melanoma   4. Family history of squamous cell carcinoma of skin     HISTORY OF PRESENT ILLNESS:   Ms. Heldt, a 72 y.o. female, was seen for a [facility] cancer genetics consultation at the request of Dr. Donne Hazel due to a personal and family history of breast cancer.  Ms. Seibold presents to clinic today to discuss the possibility of a hereditary predisposition to cancer, genetic testing, and to further clarify her future cancer risks, as well as potential cancer risks for family members.   In April 2019, at the age of 69, Ms. Meints was diagnosed with DCIS of the left breast, ER/PR-. She had a left lumpectomy on 08/14/2017. She had re-excision on 09/03/2017. Ms. Elms reports a history of a large kidney growth, oncocytoma, that was removed and was benign on pathology.  She also reports having several pre-cancerous skin lesions removed.     HORMONAL RISK FACTORS:  Menarche was at age 71.  First live birth at age 69-25.  Ovaries intact: yes.  Hysterectomy: yes.  Menopausal status: postmenopausal.  Colonoscopy: yes; reportedly normal, was told she does not need another one unless she has problems.. Mammogram within the last year: yes.   Past Medical History:  Diagnosis Date  . Cancer ([place]) 07/2017   left breast cancer  . Chronic back pain   . Diverticular disease   . Family history of breast cancer   . Family history of melanoma   . Family history of squamous cell carcinoma of skin   . GERD (gastroesophageal reflux disease)   . Glaucoma (increased eye pressure)   . Hyperglycemia   . Oncocytoma    of the kidney  . PONV (postoperative nausea and vomiting)   . Seasonal allergies     Past Surgical History:   Procedure Laterality Date  . APPENDECTOMY    . BREAST CYST EXCISION Bilateral    benign  . BREAST LUMPECTOMY WITH RADIOACTIVE SEED LOCALIZATION Left 08/14/2017   Procedure: BREAST LUMPECTOMY WITH RADIOACTIVE SEED LOCALIZATION;  Surgeon: Rolm Bookbinder, MD;  Location: [facility];  Service: General;  Laterality: Left;  . CATARACT EXTRACTION Bilateral    lens inplants  . KIDNEY CYST REMOVAL    . RE-EXCISION OF BREAST LUMPECTOMY Left 09/03/2017   Procedure: RE-EXCISION LEFT BREAST MARGIN;  Surgeon: Rolm Bookbinder, MD;  Location: [facility];  Service: General;  Laterality: Left;  Marland Kitchen VAGINAL HYSTERECTOMY     partial    Social History   Socioeconomic History  . Marital status: Married    Spouse name: Not on file  . Number of children: 2  . Years of education: Not on file  . Highest education level: Not on file  Occupational History  . Occupation: retired  Scientific laboratory technician  . Financial resource strain: Not on file  . Food insecurity:    Worry: Not on file    Inability: Not on file  . Transportation needs:    Medical: Not on file    Non-medical: Not on file  Tobacco Use  . Smoking status: Never Smoker  . Smokeless tobacco: Never Used  Substance and Sexual Activity  . Alcohol use: No  . Drug use: No  .  Sexual activity: Not on file  Lifestyle  . Physical activity:    Days per week: Not on file    Minutes per session: Not on file  . Stress: Not on file  Relationships  . Social connections:    Talks on phone: Not on file    Gets together: Not on file    Attends religious service: Not on file    Active member of club or organization: Not on file    Attends meetings of clubs or organizations: Not on file    Relationship status: Not on file  Other Topics Concern  . Not on file  Social History Narrative  . Not on file     FAMILY HISTORY:  We obtained a detailed, 4-generation family history.  Significant diagnoses are listed below: Family  History  Problem Relation Age of Onset  . Diabetes Father   . Skin cancer Father 19       squamous cell  . Breast cancer Paternal Aunt 65  . Melanoma Sister 75  . Cancer Maternal Grandmother 60       type unk  . Cancer Paternal Grandmother 76       mouth  . Breast cancer Paternal Aunt 84  . Breast cancer Other 34  . Colon cancer Neg Hx   . Rectal cancer Neg Hx   . Throat cancer Neg Hx    Ms. Crew has a son who is 53 and a daughter who is 1 with no history of cancer.  She has 4 grandchildren, 1 is adopted, the others are biological.  No history of cancer in these relatives.  Ms. Edrick Oh has 2 sisters and 3 brothers: -1 sister had melanoma in her late 23's on her back.  She is now 27. -1 sister is 23 with no history of cancer.  She had a daughter who developed breast cancer at 11 and is now 47.  She had BRCA1/2 testing done at the time of her diagnosis (12 years ago) that was negative.   -1 brother died at 70 with no history of cancer.  -2 other brothers ages 1 and 80 with no history of cancer.    Ms. Sickles father: died at 40 due to squamous cell carcinoma on his hand.  She reports he did not get prompt treatment of this cancer and therefore it caused his death. He also had diabetes.  Paternal aunts/Uncles: 4 paternal aunts, 5-6 paternal uncles. 2 paternal aunts were diagnosed with breast cancer- ages late 24's and in 41's.  No other known history of cancer.  3 other aunts/uncles who died at birth/as babies.   Paternal cousins: no known history of cancer.  Paternal grandfather: died at 33, no history of cancer.  Paternal grandmother:died in her 52's due to cancer in her mouth.    Ms. Wideman mother: died at 38 no history of cancer.  She had her uterus and ovaries intact.  Maternal Aunts/Uncles: unk Maternal cousins: unk Maternal grandfather: no history of cancer Maternal grandmother:died of cancer, type unk in her 50's/60's.   Patient's maternal ancestors are of Northern European  descent, and paternal ancestors are of Northern European descent. There is no reported Ashkenazi Jewish ancestry. There is no known consanguinity.  GENETIC COUNSELING ASSESSMENT: Louise Rawson is a 72 y.o. female with a personal and family history which is somewhat suggestive of a Hereditary Cancer Predisposition Syndrome. We, therefore, discussed and recommended the following at today's visit.   DISCUSSION: We reviewed the characteristics, features and  inheritance patterns of hereditary cancer syndromes. We also discussed genetic testing, including the appropriate family members to test, the process of testing, insurance coverage and turn-around-time for results. We discussed the implications of a negative, positive and/or variant of uncertain significant result. We recommended Ms. Horgan pursue genetic testing for the Common Hereditary Cancers gene panel + melanoma + renal/urinary tract cancer panel.  Ms. Tine expressed she would like a more targeted panel.  We therefore then recommended the Breast and Gyn cancer panel + melanoma panel + Basal cell nevus syndrome panel.   Breast and Gyn Cancers Panel: ATM BARD1 BRCA1 BRCA2 BRIP1 CDH1 CHEK2 DICER1 EPCAM MLH1 MSH2 MSH6 NBN NF1 PALB2 PMS2 PTEN RAD50 RAD51C RAD51D SMARCA4 STK11 TP53   The Melanoma panel offered by [facility] includes sequencing and/or deletion duplication testing of the following 9 genes: BAP1, BRCA2, BRIP1, CDK4, CDKN2A (p14ARF), CDKN2A (p16INK4a), POT1, PTEN, RB1, and TP53.  The following gene was evaluated for sequence changes only: MITF (c.952G>A, p.GLU318Lys variant only).  Basal Cell Nevus: SUFU, PTCH1  We discussed that only 5-10% of cancers are associated with a Hereditary cancer predisposition syndrome.  One of the most common hereditary cancer syndromes that increases breast cancer risk is called Hereditary Breast and Ovarian Cancer (HBOC) syndrome.  This syndrome is caused by mutations in the BRCA1 and BRCA2 genes.  This syndrome  increases an individual's lifetime risk to develop breast, ovarian, pancreatic, and other types of cancer.  There are also many other cancer predisposition syndromes caused by mutations in several other genes.  We discussed that if she is found to have a mutation in one of these genes, it may impact surgical decisions, and alter future medical management recommendations such as increased cancer screenings and consideration of risk reducing surgeries.  A positive result could also have implications for the patient's family members.  A Negative result would mean we were unable to identify a hereditary component to her cancer, but does not rule out the possibility of a hereditary basis for her cancer.  There could be mutations that are undetectable by current technology, or in genes not yet tested or identified to increase cancer risk.    We discussed the potential to find a Variant of Uncertain Significance or VUS.  These are variants that have not yet been identified as pathogenic or benign, and it is unknown if this variant is associated with increased cancer risk or if this is a normal finding.  Most VUS's are reclassified to benign or likely benign.   It should not be used to make medical management decisions. With time, we suspect the lab will determine the significance of any VUS's identified if any.   Based on Ms. Fuhrmann's personal and family history of cancer, she meets medical criteria for genetic testing. Despite that she meets criteria, she may still have an out of pocket cost. Medicare patients typically have $0 OOP cost.   PLAN: After considering the risks, benefits, and limitations, Ms. Wallick  provided informed consent to pursue genetic testing and the blood sample was sent to [facility] for analysis of the Breast and Gyn Cancer panel + melanoma panel + Basal cell nevus syndrome genes. Results should be available within approximately 2-3 weeks' time, at which point they will be  disclosed by telephone to Ms. Jodoin, as will any additional recommendations warranted by these results. Ms. Biel will receive a summary of her genetic counseling visit and a copy of her results once available. This information will also be  available in [place]. We encouraged Ms. Neisen to remain in contact with cancer genetics annually so that we can continuously update the family history and inform her of any changes in cancer genetics and testing that may be of benefit for her family. Ms. Wellman questions were answered to her satisfaction today. Our contact information was provided should additional questions or concerns arise.  Based on Ms. Casebier's family history, we recommended her niece with breast cancer at 32 have updated genetic testing. , have genetic counseling and testing. Ms. Runner will let us know if we can be of any assistance in coordinating genetic counseling and/or testing for this family member.   Lastly, we encouraged Ms. Forget to remain in contact with cancer genetics annually so that we can continuously update the family history and inform her of any changes in cancer genetics and testing that may be of benefit for this family.   Ms.  Polizzi questions were answered to her satisfaction today. Our contact information was provided should additional questions or concerns arise. Thank you for the referral and allowing [place] to share in the care of your patient.   Tana Felts, MS, [facility] Certified Genetic Counselor [email or web address] phone: [phone]  The patient was seen for a total of 55 minutes in face-to-face genetic counseling.  The patient was accompanied today by her daughter. This patient was discussed with Drs. Magrinat, Lindi Adie and/or Burr Medico who agrees with the above.

## 2017-10-02 ENCOUNTER — Ambulatory Visit
Admission: RE | Admit: 2017-10-02 | Discharge: 2017-10-02 | Disposition: A | Discharge status: Home / Self Care | Payer: Medicare Other | Source: Ambulatory Visit | Attending: Radiation Oncology | Admitting: Radiation Oncology

## 2017-10-02 ENCOUNTER — Other Ambulatory Visit: Payer: Self-pay

## 2017-10-02 ENCOUNTER — Encounter: Payer: Self-pay | Admitting: Radiation Oncology

## 2017-10-02 VITALS — BP 142/65 | HR 57 | Temp 97.6°F | Resp 18 | Ht 64.0 in | Wt 172.8 lb

## 2017-10-02 DIAGNOSIS — Z510 Encounter for antineoplastic radiation therapy: Secondary | ICD-10-CM | POA: Diagnosis not present

## 2017-10-02 DIAGNOSIS — D0512 Intraductal carcinoma in situ of left breast: Secondary | ICD-10-CM | POA: Insufficient documentation

## 2017-10-02 NOTE — Progress Notes (Signed)
  Radiation Oncology         [phone] ________________________________  Name: Stacey Church MRN: [id number]  Date: 10/02/2017  DOB: April 10, 1946  SIMULATION AND TREATMENT PLANNING NOTE    ICD-10-CM   1. Ductal carcinoma in situ of left breast D05.12     DIAGNOSIS:  Ductal carcinoma in situ of the left breast, high-grade, ER negative, PR negative   NARRATIVE:  The patient was brought to the [facility].  Identity was confirmed.  All relevant records and images related to the planned course of therapy were reviewed.  The patient freely provided informed written consent to proceed with treatment after reviewing the details related to the planned course of therapy. The consent form was witnessed and verified by the simulation staff.  Then, the patient was set-up in a stable reproducible  supine position for radiation therapy.  CT images were obtained.  Surface markings were placed.  The CT images were loaded into the planning software.  Then the target and avoidance structures were contoured.  Treatment planning then occurred.  The radiation prescription was entered and confirmed.  Then, I designed and supervised the construction of a total of 3 medically necessary complex treatment devices.  I have requested : 3D Simulation  I have requested a DVH of the following structures: heart, lungs, lumpectomy cavity.  I have ordered:dose calc.  PLAN:  The patient will receive 40.05 Gy in 15 fractions directed at the left breast area, followed by a boost to the lumpectomy cavity of 10 gray in 5 fractions.   Optical Surface Tracking Plan:  Since intensity modulated radiotherapy (IMRT) and 3D conformal radiation treatment methods are predicated on accurate and precise positioning for treatment, intrafraction motion monitoring is medically necessary to ensure accurate and safe treatment delivery.  The ability to quantify intrafraction motion without excessive ionizing radiation dose can only be  performed with optical surface tracking. Accordingly, surface imaging offers the opportunity to obtain 3D measurements of patient position throughout IMRT and 3D treatments without excessive radiation exposure.  I am ordering optical surface tracking for this patient's upcoming course of radiotherapy. ________________________________     Blair Promise, PhD, MD  This document serves as a record of services personally performed by Gery Pray MD. It was created on his behalf by Delton Coombes, a trained medical scribe. The creation of this record is based on the scribe's personal observations and the provider's statements to them.

## 2017-10-02 NOTE — Progress Notes (Addendum)
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 - IN SITU CARCINOMA IS <0.1 CM OF THE POSTERIOR MARGIN BROADLY AND 0.1-0.2 CM OF THE LATERAL MARGIN MULTIFOCALLY. - LOBULAR NEOPLASIA (ATYPICAL LOBULAR HYPERPLASIA).  Receptor Status: ER(Negative), PR (Negative)  Did patient present with symptoms (if so, please note symptoms) or was this found on screening mammography?:  06/11/2017 Screening mammography showed C density breasts  Past/Anticipated interventions by surgeon, if any:  Diagnosis 09-03-17 Dr. Rolm Bookbinder 1. Breast, excision, Left additional Lateral Margin - MICROSCOPIC FOCUS OF HIGH GRADE DUCTAL CARCINOMA IN SITU. - NEW MARGIN IS NEGATIVE FOR CARCINOMA. 2. Breast, excision, Left additional Posterior Margin - EXCISION SITE CHANGES. - NO MALIGNANCY IDENTIFIED.   08/14/2017 Surgeon: Dr. Rolm Bookbinder Left breast lumpectomy with radioactive seed localization  09/03/2017 Surgeon: Dr. Rolm Bookbinder Leftbreast re-excision posterior and lateral margins  Past/Anticipated interventions by medical oncology, if any: Chemotherapy: None  Lymphedema issues, if any:  None   Pain issues, if any:2/10 left arm   Discomfort towards the end of the day on the left lateral side of the breast improving.  SAFETY ISSUES:  Prior radiation? No  Pacemaker/ICD? No  Possible current pregnancy? No  Is the patient on methotrexate? No Skin to left breast healing without signs of infection. ROM good to left arm.  Saw Dr. Donne Hazel 09-24-17 for follow up appointment. Current Complaints / other details:  Stacey Church presents today for a follow up consult regarding her left breast cancer. Had a breast re-excision on 09/03/2017 by Dr. Donne Hazel (did not want to pursue total mastectomy). She also  met with a genetic counselor on 09/24/2017.  Wt Readings from Last 3 Encounters:  10/02/17 172 lb 12.8 oz (78.4 kg)  09/03/17 171 lb (77.6 kg)  08/28/17 173 lb 12.8 oz (78.8 kg)  BP (!) 142/65 (BP Location: Right Arm, Patient Position: Sitting, Cuff Size: Normal)   Pulse (!) 57   Temp 97.6 F (36.4 C) (Oral)   Resp 18   Ht 5' 4"  (1.626 m)   Wt 172 lb 12.8 oz (78.4 kg)   SpO2 98%   BMI 29.66 kg/m

## 2017-10-02 NOTE — Progress Notes (Signed)
Radiation Oncology         [phone] ________________________________  Name: Stacey Church MRN: [id number]  Date: 10/02/2017  DOB: 1946-04-11  Re- evaluation Note  CC: Pomposini, Cherly Anderson, MD  Rolm Bookbinder, MD    ICD-10-CM   1. Ductal carcinoma in situ of left breast D05.12     Diagnosis:   Ductal carcinoma in situ of the left breast, high grade, ER negative, PR negative   Narrative:  The patient returns today for a re- evaluation. Screening mammography showed C density breast on 06/11/2017.  On 08/14/2017 she had a left with seed breast lumpectomy which revealed high grade ductal carcinoma in situ with calcifications, spanning approximately 0.9 cm.  On 09/03/2017 patient underwent Left breast re- excision posterior and lateral margins by Dr. Rolm Bookbinder. Patient reported that after her surgery, her left breast was  red and sensitive she was given antibiotics to help with the redness on 09/29/2017. She expressed that she didn't feel like that antibiotics reduced the redness. She saw Dr. Donne Hazel on 09/24/2017 for a follow up visit and also saw a genetic counselor on 09/24/2017. She reported a 2/10 pain in her left arm.                  ALLERGIES:  is allergic to bactrim [sulfamethoxazole-trimethoprim]; macrobid [nitrofurantoin monohyd macro]; and tylenol [acetaminophen].  Meds: Current Outpatient Medications  Medication Sig Dispense Refill  . Calcium Carbonate-Vitamin D (CALTRATE 600+D) 600-400 MG-UNIT per tablet Take 1 tablet by mouth 2 (two) times daily.    . cholecalciferol (VITAMIN D) 1000 UNITS tablet Take 1,000 Units by mouth daily.    . Cyanocobalamin (VITAMIN B 12 PO) Take 1 tablet by mouth every morning.    . folic acid (FOLVITE) 244 MCG tablet Take 400 mcg by mouth daily.    Marland Kitchen ibuprofen (ADVIL,MOTRIN) 200 MG tablet Take 400 mg by mouth daily as needed. For pain    . lansoprazole (PREVACID) 15 MG capsule Take 15 mg by mouth daily at 12 noon.    . timolol (TIMOPTIC) 0.5 %  ophthalmic solution Place 1 drop into both eyes daily.    . vitamin E 400 UNIT capsule Take 400 Units by mouth daily.     No current facility-administered medications for this encounter.     REVIEW OF SYSTEMS: A 10+ POINT REVIEW OF SYSTEMS WAS OBTAINED including neurology, dermatology, psychiatry, cardiac, respiratory, lymph, extremities, GI, GU, musculoskeletal, constitutional, reproductive, HEENT. All pertinent positives are noted in the HPI. All others are negative.  Physical Findings: The patient is in no acute distress. Patient is alert and oriented.  height is 5\' 4"  (1.626 m) and weight is 172 lb 12.8 oz (78.4 kg). Her oral temperature is 97.6 F (36.4 C). Her blood pressure is 142/65 (abnormal) and her pulse is 57 (abnormal). Her respiration is 18 and oxygen saturation is 98%. .  On breast exam, left breast she has a lumpectomy scar which is healing well with no signs of infections. The patient has some errythema in the breast but does not appear to be cellulitis. Lungs are clear to auscultation bilaterally. Heart has regular rate and rhythm. No palpable cervical, supraclavicular, or axillary adenopathy. Abdomen soft, non-tender, normal bowel sounds.  Lab Findings: Lab Results  Component Value Date   WBC 5.8 12/02/2011   HGB 13.1 12/02/2011   HCT 38.7 12/02/2011   MCV 91.1 12/02/2011   PLT 87 (L) 12/02/2011    Radiographic Findings: No results found.  Impression:  72 y.o. women with high gradeductal carcinoma in situ of the left breast,  ER negative, PR negative.  patient proceeded with re-excision. margins within the addition lateral margin there was a microscopic focus of high grade DCIS, this new margin was 0.5 cm. The excision of the posterior margin revealed no evidence of malignancy.  The patients margins have been cleared.  I discussed the course of treatment and possible toxicities. She along with her husband appear to understand and wish to continue with course of treatment  planned. Given the high grade of the malignancy with that being hormone receptor negative, the  patient does want to be agressive with management and does wish to proceed with adjuvant radiotherapy.  Plan:  She will proceed with CT simulation later today to save her one trip,  given her lengthy drive to the facility. She would be a good candidate for hypofractionated accelerated radiation therapy.     ___________________________________  Blair Promise, PhD, MD  This document serves as a record of services personally performed by Gery Pray MD. It was created on his behalf by Delton Coombes, a trained medical scribe. The creation of this record is based on the scribe's personal observations and the provider's statements to them.

## 2017-10-07 ENCOUNTER — Telehealth: Payer: Self-pay | Admitting: Genetics

## 2017-10-07 DIAGNOSIS — Z510 Encounter for antineoplastic radiation therapy: Secondary | ICD-10-CM | POA: Diagnosis not present

## 2017-10-07 NOTE — Telephone Encounter (Signed)
Revealed negative genetic testing.  Revealed that a VUS in [facility] was identified.    This normal result is reassuring and indicates that it is unlikely Stacey Church's cancer is due to a hereditary cause.  It is unlikely that there is an increased risk of another cancer due to a mutation in one of these genes.  However, genetic testing is not perfect, and cannot definitively rule out a hereditary cause.  It will be important for her to keep in contact with genetics to learn if any additional testing may be needed in the future.   Recommended niece with breast cancer have updated testing (hers was 12 years ago).   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  Stacey Church denies any history of pneumothorax or lung cysts.  She did report that her dermatologist, Lonzo Cloud, has removed several skin lesions that he has said was hereditary. She does not remember the name.  She said she could call her dermatologist office to get the name. 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  She decided she would like to go ahead and reflex testing to the renal/urniary tract panel to analyze the FLCN and other renal cancer/cyst genes.   I will call back with her updated results, and she will see if she is able to get the name of her skin lesions from her dermatologist.   She declined testing for any GI cancer genes.

## 2017-10-09 ENCOUNTER — Telehealth: Payer: Self-pay | Admitting: Genetics

## 2017-10-09 ENCOUNTER — Encounter: Payer: Self-pay | Admitting: Genetics

## 2017-10-09 ENCOUNTER — Ambulatory Visit
Admission: RE | Admit: 2017-10-09 | Discharge: 2017-10-09 | Disposition: A | Discharge status: Home / Self Care | Payer: Medicare Other | Source: Ambulatory Visit | Attending: Radiation Oncology | Admitting: Radiation Oncology

## 2017-10-09 ENCOUNTER — Ambulatory Visit: Payer: Self-pay | Admitting: Genetics

## 2017-10-09 DIAGNOSIS — D0512 Intraductal carcinoma in situ of left breast: Secondary | ICD-10-CM

## 2017-10-09 DIAGNOSIS — Z803 Family history of malignant neoplasm of breast: Secondary | ICD-10-CM

## 2017-10-09 DIAGNOSIS — Z510 Encounter for antineoplastic radiation therapy: Secondary | ICD-10-CM | POA: Diagnosis not present

## 2017-10-09 DIAGNOSIS — Z808 Family history of malignant neoplasm of other organs or systems: Secondary | ICD-10-CM

## 2017-10-09 DIAGNOSIS — Z1379 Encounter for other screening for genetic and chromosomal anomalies: Secondary | ICD-10-CM

## 2017-10-09 NOTE — Progress Notes (Signed)
HPI:  Stacey Church was previously seen in the [facility] on 09/24/2017 due to a personal and family history of breast cancer and concerns regarding a hereditary predisposition to cancer. Please refer to our prior cancer genetics clinic note for more information regarding Stacey Church's medical, social and family histories, and our assessment and recommendations, at the time. Stacey Church recent genetic test results were disclosed to her, as well as recommendations warranted by these results. These results and recommendations are discussed in more detail below.  In April 2019, at the age of 72, Stacey Church was diagnosed with DCIS of the left breast, ER/PR-. She had a left lumpectomy on 08/14/2017. She had re-excision on 09/03/2017. 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  She also reports having several pre-cancerous skin lesions removed  CANCER HISTORY:    Ductal carcinoma in situ of left breast   08/28/2017 Initial Diagnosis    Ductal carcinoma in situ of left breast      10/07/2017 Genetic Testing    The Breast & Gyn cancers panel + Renal/Urinarty Tract Cancers Panel + Melanoma Panel + Basal Cell Nevus panel was ordered.  BARD1, BRCA1, BRCA2, BRIP1, CDC73, CDH1, CDK4, CDKN1C, CDKN2A (p14ARF), CDKN2A (p16INK4a), CHEK2, DICER1, DIS3L2, EPCAM*, FH, FLCN, GPC3, MET, MLH1, MSH2, MSH6, NBN, NF1, PALB2, PMS2, POT1, PTCH1, PTEN, RAD50, RAD51C, RAD51D, RB1, SDHB, SDHC, SMARCA4, SMARCB1, STK11, SUFU, TP53, TSC1, TSC2, VHL, WT1 The following genes were evaluated for sequence changes only: MITF*  Results: No pathogenic variants identified.  A Variant of Uncertain Significance (VUS) in the gene SMARCA4 was identified c.1076G>A (p.Arg359Gln).  VUS's should not be used to make medical management decisions.  The date of this test report is 10/07/2017.         FAMILY HISTORY:  We obtained a detailed, 4-generation family history.  Significant diagnoses are listed  below: Family History  Problem Relation Age of Onset  . Diabetes Father   . Skin cancer Father 27       squamous cell  . Breast cancer Paternal Aunt 41  . Melanoma Sister 7  . Cancer Maternal Grandmother 60       type unk  . Cancer Paternal Grandmother 86       mouth  . Breast cancer Paternal Aunt 58  . Breast cancer Other 34  . Colon cancer Neg Hx   . Rectal cancer Neg Hx   . Throat cancer Neg Hx     Stacey Church has a son who is 1 and a daughter who is 73 with no history of cancer.  She has 4 grandchildren, 1 is adopted, the others are biological.  No history of cancer in these relatives.  Stacey Church has 2 sisters and 3 brothers: -1 sister had melanoma in her late 34's on her back.  She is now 36. -1 sister is 39 with no history of cancer.  She had a daughter who developed breast cancer at 41 and is now 65.  She had BRCA1/2 testing done at the time of her diagnosis (12 years ago) that was negative.   -1 brother died at 71 with no history of cancer.  -2 other brothers ages 29 and 3 with no history of cancer.    Stacey Church father: died at 14 due to squamous cell carcinoma on his hand.  She reports he did not get prompt treatment of this cancer and therefore it caused his death. He also  had diabetes.  Paternal aunts/Uncles: 4 paternal aunts, 5-6 paternal uncles. 2 paternal aunts were diagnosed with breast cancer- ages late 35's and in 56's.  No other known history of cancer.  3 other aunts/uncles who died at birth/as babies.   Paternal cousins: no known history of cancer.  Paternal grandfather: died at 31, no history of cancer.  Paternal grandmother:died in her 8's due to cancer in her mouth.    Stacey Church mother: died at 72 no history of cancer.  She had her uterus and ovaries intact.  Maternal Aunts/Uncles: unk Maternal cousins: unk Maternal grandfather: no history of cancer Maternal grandmother:died of cancer, type unk in her 50's/60's.   Patient's maternal ancestors are of  [place] descent, and paternal ancestors are of [place] European descent. There is no reported Ashkenazi Jewish ancestry. There is no known consanguinity.  GENETIC TEST RESULTS: Genetic testing performed through [facility] Breast & Gyn Cancers Panel + Renal/Urinary Tract Cancers Panel + Melanoma Panel + Basal Cell Nevus Panel reported out on 10/07/2017 showed no pathogenic mutations. The following genes were evaluated for sequence changes and exonic deletions/duplications: ATM, BAP1, BARD1, BRCA1, BRCA2, BRIP1, CDC73, CDH1, CDK4, CDKN1C, CDKN2A (p14ARF), CDKN2A (p16INK4a), CHEK2, DICER1, DIS3L2, EPCAM*, FH, FLCN, GPC3, MET, MLH1, MSH2, MSH6, NBN, NF1, PALB2, PMS2, POT1, PTCH1, PTEN, RAD50, RAD51C, RAD51D, RB1, SDHB, SDHC, SMARCA4, SMARCB1, STK11, SUFU, TP53, TSC1, TSC2, VHL, WT1. The following genes were evaluated for sequence changes only: MITF*.  A variant of uncertain significance (VUS) in a gene called SMARCA4 was also noted. c.1076G>A (p.Arg359Gln)  The test report will be scanned into EPIC and will be located under the Molecular Pathology section of the Results Review tab. A portion of the result report is included below for reference.     We discussed with Stacey Church that because current genetic testing is not perfect, it is possible there may be a gene mutation in one of these genes that current testing cannot detect, but that chance is small.  We also discussed, that there could be another gene that has not yet been discovered, or that we have not yet tested, that is responsible for the cancer diagnoses in the family. It is also possible there is a hereditary cause for the cancer in the family that Stacey Church did not inherit and therefore was not identified in her testing.  Therefore, it is important to remain in touch with cancer genetics in the future so that we can continue to offer Stacey Church the most up to date genetic testing.   Regarding the VUS in SMARCA4: At this time, it is  unknown if this variant is associated with increased cancer risk or if this is a normal finding, but most variants such as this get reclassified to being inconsequential. It should not be used to make medical management decisions. With time, we suspect the lab will determine the significance of this variant, if any. If we do learn more about it, we will try to contact Stacey Church to discuss it further. However, it is important to stay in touch with [place] periodically and keep the address and phone number up to date.  ADDITIONAL GENETIC TESTING: We discussed with Stacey Church that there are other genes that are associated with increased cancer risk that can be analyzed. The laboratories that offer this testing look at these additional genes via a hereditary cancer gene panel. Should Stacey Church wish to pursue additional genetic testing, we are happy to discuss and coordinate this testing, at  any time.    CANCER SCREENING RECOMMENDATIONS: Stacey Church test result is considered negative (normal).  This means that we have not identified a hereditary cause for her personal and family history of cancer at this time.   While reassuring, this does not definitively rule out a hereditary predisposition to cancer. It is still possible that there could be genetic mutations that are undetectable by current technology, or genetic mutations in genes that have not been tested or identified to increase cancer risk.  Therefore, it is recommended she continue to follow the cancer management and screening guidelines provided by her oncology and primary healthcare provider. An individual's cancer risk is not determined by genetic test results alone.  Overall cancer risk assessment includes additional factors such as personal medical history, family history, etc.  These should be used to make a personalized plan for cancer prevention and surveillance.    She should continue routine skin exams with her dermatologist. The patient did report  that her dermatologist has removed several skin bumps that she has been informed are hereditary. She does not know what these were called, and pathology reports of these removed lesions are unavailable for review.  If these were fibrofolliculomas or trichodiscomas, we recommend evaluation to determine if she meets clinical criteria for Birt-Hogg Dube Syndrome (given her history of onocytoma).  Her genetic testing was negative for the FLCN gene (however, genetic testing does not detect 100% of Birt-Hogg Dube cases). Patient denies history of lung cysts or pneumothorax.   RECOMMENDATIONS FOR FAMILY MEMBERS:  Relatives in this family might be at some increased risk of developing cancer, over the general population risk, simply due to the family history of cancer.  We recommended women in this family have a yearly mammogram beginning at age 63, or 68 years younger than the earliest onset of cancer, an annual clinical breast exam, and perform monthly breast self-exams. Women in this family should also have a gynecological exam as recommended by their primary provider. All family members should have a colonoscopy by age 74 (or as directed by their doctors).  All family members should inform their physicians about the family history of cancer so their doctors can make the most appropriate screening recommendations for them.   It is also possible there is a hereditary cause for the cancer in Stacey Church's family that she did not inherit and therefore was not identified in her.  We recommended niece with breast caner dx in her 8's, have genetic counseling and updated genetic testing. Stacey Church will let us know if we can be of any assistance in coordinating genetic counseling and/or testing for these family members.   FOLLOW-UP: Lastly, we discussed with Ms. Mcisaac that cancer genetics is a rapidly advancing field and it is possible that new genetic tests will be appropriate for her and/or her family members in the  future. We encouraged her to remain in contact with cancer genetics on an annual basis so we can update her personal and family histories and let her know of advances in cancer genetics that may benefit this family.   Our contact number was provided. Ms. Daris questions were answered to her satisfaction, and she knows she is welcome to call us at anytime with additional questions or concerns.   Ferol Luz, MS, [facility] Certified Genetic Counselor [email or web address]

## 2017-10-09 NOTE — Telephone Encounter (Signed)
Revealed negative genetic test results for the additional renal cancer genes that were added on.    Patient requested sending records to dermatolgosit Lonzo Cloud, OB/YNG Marty Heck, and PCP daniel Pomposini.  Recommended niece with breast cancer have updated genetic testing.

## 2017-10-09 NOTE — Progress Notes (Signed)
  Radiation Oncology         [phone] ________________________________  Name: Stacey Church MRN: [id number]  Date: 10/09/2017  DOB: 02/07/1946  Simulation Verification Note    ICD-10-CM   1. Ductal carcinoma in situ of left breast D05.12     Status: outpatient  NARRATIVE: The patient was brought to the treatment unit and placed in the planned treatment position. The clinical setup was verified. Then port films were obtained and uploaded to the radiation oncology medical record software.  The treatment beams were carefully compared against the planned radiation fields. The position location and shape of the radiation fields was reviewed. They targeted volume of tissue appears to be appropriately covered by the radiation beams. Organs at risk appear to be excluded as planned.  Based on my personal review, I approved the simulation verification. The patient's treatment will proceed as planned.  -----------------------------------  Blair Promise, PhD, MD

## 2017-10-10 ENCOUNTER — Ambulatory Visit
Admission: RE | Admit: 2017-10-10 | Discharge: 2017-10-10 | Disposition: A | Discharge status: Home / Self Care | Payer: Medicare Other | Source: Ambulatory Visit | Attending: Radiation Oncology | Admitting: Radiation Oncology

## 2017-10-10 DIAGNOSIS — Z510 Encounter for antineoplastic radiation therapy: Secondary | ICD-10-CM | POA: Diagnosis not present

## 2017-10-11 ENCOUNTER — Ambulatory Visit
Admission: RE | Admit: 2017-10-11 | Discharge: 2017-10-11 | Disposition: A | Discharge status: Home / Self Care | Payer: Medicare Other | Source: Ambulatory Visit | Attending: Radiation Oncology | Admitting: Radiation Oncology

## 2017-10-11 DIAGNOSIS — Z510 Encounter for antineoplastic radiation therapy: Secondary | ICD-10-CM | POA: Diagnosis not present

## 2017-10-14 ENCOUNTER — Ambulatory Visit
Admission: RE | Admit: 2017-10-14 | Discharge: 2017-10-14 | Disposition: A | Discharge status: Home / Self Care | Payer: Medicare Other | Source: Ambulatory Visit | Attending: Radiation Oncology | Admitting: Radiation Oncology

## 2017-10-14 DIAGNOSIS — Z510 Encounter for antineoplastic radiation therapy: Secondary | ICD-10-CM | POA: Diagnosis not present

## 2017-10-15 ENCOUNTER — Ambulatory Visit
Admission: RE | Admit: 2017-10-15 | Discharge: 2017-10-15 | Disposition: A | Discharge status: Home / Self Care | Payer: Medicare Other | Source: Ambulatory Visit | Attending: Radiation Oncology | Admitting: Radiation Oncology

## 2017-10-15 DIAGNOSIS — Z510 Encounter for antineoplastic radiation therapy: Secondary | ICD-10-CM | POA: Diagnosis not present

## 2017-10-15 DIAGNOSIS — D0512 Intraductal carcinoma in situ of left breast: Secondary | ICD-10-CM

## 2017-10-15 MED ORDER — RADIAPLEXRX EX GEL
Freq: Once | CUTANEOUS | Status: AC
  Administered 2017-10-15: 17:00:00 via TOPICAL

## 2017-10-15 MED ORDER — ALRA NON-METALLIC DEODORANT (RAD-ONC)
1.0000 "application " | Freq: Once | TOPICAL | Status: AC
  Administered 2017-10-15: 1 via TOPICAL

## 2017-10-16 ENCOUNTER — Ambulatory Visit
Admission: RE | Admit: 2017-10-16 | Discharge: 2017-10-16 | Disposition: A | Discharge status: Home / Self Care | Payer: Medicare Other | Source: Ambulatory Visit | Attending: Radiation Oncology | Admitting: Radiation Oncology

## 2017-10-16 DIAGNOSIS — Z510 Encounter for antineoplastic radiation therapy: Secondary | ICD-10-CM | POA: Diagnosis not present

## 2017-10-17 ENCOUNTER — Ambulatory Visit
Admission: RE | Admit: 2017-10-17 | Discharge: 2017-10-17 | Disposition: A | Discharge status: Home / Self Care | Payer: Medicare Other | Source: Ambulatory Visit | Attending: Radiation Oncology | Admitting: Radiation Oncology

## 2017-10-17 DIAGNOSIS — Z510 Encounter for antineoplastic radiation therapy: Secondary | ICD-10-CM | POA: Diagnosis not present

## 2017-10-18 ENCOUNTER — Ambulatory Visit
Admission: RE | Admit: 2017-10-18 | Discharge: 2017-10-18 | Disposition: A | Discharge status: Home / Self Care | Payer: Medicare Other | Source: Ambulatory Visit | Attending: Radiation Oncology | Admitting: Radiation Oncology

## 2017-10-18 DIAGNOSIS — Z510 Encounter for antineoplastic radiation therapy: Secondary | ICD-10-CM | POA: Diagnosis not present

## 2017-10-21 ENCOUNTER — Ambulatory Visit
Admission: RE | Admit: 2017-10-21 | Discharge: 2017-10-21 | Disposition: A | Discharge status: Home / Self Care | Payer: Medicare Other | Source: Ambulatory Visit | Attending: Radiation Oncology | Admitting: Radiation Oncology

## 2017-10-21 DIAGNOSIS — Z510 Encounter for antineoplastic radiation therapy: Secondary | ICD-10-CM | POA: Diagnosis not present

## 2017-10-22 ENCOUNTER — Ambulatory Visit
Admission: RE | Admit: 2017-10-22 | Discharge: 2017-10-22 | Disposition: A | Discharge status: Home / Self Care | Payer: Medicare Other | Source: Ambulatory Visit | Attending: Radiation Oncology | Admitting: Radiation Oncology

## 2017-10-22 DIAGNOSIS — Z510 Encounter for antineoplastic radiation therapy: Secondary | ICD-10-CM | POA: Diagnosis not present

## 2017-10-23 ENCOUNTER — Ambulatory Visit
Admission: RE | Admit: 2017-10-23 | Discharge: 2017-10-23 | Disposition: A | Discharge status: Home / Self Care | Payer: Medicare Other | Source: Ambulatory Visit | Attending: Radiation Oncology | Admitting: Radiation Oncology

## 2017-10-23 DIAGNOSIS — Z510 Encounter for antineoplastic radiation therapy: Secondary | ICD-10-CM | POA: Diagnosis not present

## 2017-10-24 ENCOUNTER — Ambulatory Visit
Admission: RE | Admit: 2017-10-24 | Discharge: 2017-10-24 | Disposition: A | Discharge status: Home / Self Care | Payer: Medicare Other | Source: Ambulatory Visit | Attending: Radiation Oncology | Admitting: Radiation Oncology

## 2017-10-24 DIAGNOSIS — Z510 Encounter for antineoplastic radiation therapy: Secondary | ICD-10-CM | POA: Diagnosis not present

## 2017-10-25 ENCOUNTER — Ambulatory Visit
Admission: RE | Admit: 2017-10-25 | Discharge: 2017-10-25 | Disposition: A | Discharge status: Home / Self Care | Payer: Medicare Other | Source: Ambulatory Visit | Attending: Radiation Oncology | Admitting: Radiation Oncology

## 2017-10-25 DIAGNOSIS — Z510 Encounter for antineoplastic radiation therapy: Secondary | ICD-10-CM | POA: Diagnosis not present

## 2017-10-28 ENCOUNTER — Ambulatory Visit
Admission: RE | Admit: 2017-10-28 | Discharge: 2017-10-28 | Disposition: A | Discharge status: Home / Self Care | Payer: Medicare Other | Source: Ambulatory Visit | Attending: Radiation Oncology | Admitting: Radiation Oncology

## 2017-10-28 DIAGNOSIS — Z170 Estrogen receptor positive status [ER+]: Secondary | ICD-10-CM | POA: Diagnosis not present

## 2017-10-28 DIAGNOSIS — Z510 Encounter for antineoplastic radiation therapy: Secondary | ICD-10-CM | POA: Diagnosis not present

## 2017-10-28 DIAGNOSIS — D0512 Intraductal carcinoma in situ of left breast: Secondary | ICD-10-CM | POA: Diagnosis not present

## 2017-10-29 ENCOUNTER — Ambulatory Visit
Admission: RE | Admit: 2017-10-29 | Discharge: 2017-10-29 | Disposition: A | Discharge status: Home / Self Care | Payer: Medicare Other | Source: Ambulatory Visit | Attending: Radiation Oncology | Admitting: Radiation Oncology

## 2017-10-29 DIAGNOSIS — D0512 Intraductal carcinoma in situ of left breast: Secondary | ICD-10-CM

## 2017-10-29 NOTE — Progress Notes (Signed)
.  Simulation verification  The patient was brought to the treatment machine and placed in the plan treatment position.  Clinical set up was verified to ensure that the target region is appropriately covered for the patient's upcoming electron boost treatment.  The targeted volume of tissue is appropriately covered by the radiation field.  Based on my personal review, I approve the simulation verification.  The patient's treatment will proceed as planned.  ------------------------------------------------  -----------------------------------  Chevon Fomby D. Jendayi Berling, PhD, MD  

## 2017-10-30 ENCOUNTER — Ambulatory Visit
Admission: RE | Admit: 2017-10-30 | Discharge: 2017-10-30 | Disposition: A | Discharge status: Home / Self Care | Payer: Medicare Other | Source: Ambulatory Visit | Attending: Radiation Oncology | Admitting: Radiation Oncology

## 2017-10-30 DIAGNOSIS — D0512 Intraductal carcinoma in situ of left breast: Secondary | ICD-10-CM | POA: Diagnosis not present

## 2017-11-01 ENCOUNTER — Ambulatory Visit
Admission: RE | Admit: 2017-11-01 | Discharge: 2017-11-01 | Disposition: A | Discharge status: Home / Self Care | Payer: Medicare Other | Source: Ambulatory Visit | Attending: Radiation Oncology | Admitting: Radiation Oncology

## 2017-11-01 DIAGNOSIS — D0512 Intraductal carcinoma in situ of left breast: Secondary | ICD-10-CM | POA: Diagnosis not present

## 2017-11-04 ENCOUNTER — Ambulatory Visit: Payer: Medicare Other

## 2017-11-05 ENCOUNTER — Ambulatory Visit
Admission: RE | Admit: 2017-11-05 | Discharge: 2017-11-05 | Disposition: A | Discharge status: Home / Self Care | Payer: Medicare Other | Source: Ambulatory Visit | Attending: Radiation Oncology | Admitting: Radiation Oncology

## 2017-11-05 ENCOUNTER — Other Ambulatory Visit: Payer: Self-pay | Admitting: Radiation Oncology

## 2017-11-05 DIAGNOSIS — D0512 Intraductal carcinoma in situ of left breast: Secondary | ICD-10-CM | POA: Diagnosis not present

## 2017-11-05 MED ORDER — SONAFINE EX EMUL
1.0000 "application " | Freq: Two times a day (BID) | CUTANEOUS | Status: DC
  Administered 2017-11-05: 1 via TOPICAL

## 2017-11-06 ENCOUNTER — Ambulatory Visit: Payer: Medicare Other

## 2017-11-06 ENCOUNTER — Ambulatory Visit
Admission: RE | Admit: 2017-11-06 | Discharge: 2017-11-06 | Disposition: A | Discharge status: Home / Self Care | Payer: Medicare Other | Source: Ambulatory Visit | Attending: Radiation Oncology | Admitting: Radiation Oncology

## 2017-11-06 DIAGNOSIS — D0512 Intraductal carcinoma in situ of left breast: Secondary | ICD-10-CM | POA: Diagnosis not present

## 2017-11-07 ENCOUNTER — Ambulatory Visit
Admission: RE | Admit: 2017-11-07 | Discharge: 2017-11-07 | Disposition: A | Discharge status: Home / Self Care | Payer: Medicare Other | Source: Ambulatory Visit | Attending: Radiation Oncology | Admitting: Radiation Oncology

## 2017-11-07 DIAGNOSIS — D0512 Intraductal carcinoma in situ of left breast: Secondary | ICD-10-CM | POA: Diagnosis not present

## 2017-11-13 ENCOUNTER — Encounter: Payer: Self-pay | Admitting: Radiation Oncology

## 2017-11-13 NOTE — Progress Notes (Signed)
  Radiation Oncology         [phone] ________________________________  Name: Stacey Church MRN: [id number]  Date: 11/13/2017  DOB: 1946/03/18  End of Treatment Note  Diagnosis: Ductal carcinoma in situ of the left breast, high grade, ER negative, PR negative   Indication for treatment:  Curative       Radiation treatment dates:   10/09/2017-11/07/2017  Site/dose:   1. Left breast, 2.67 Gy in 15 fractions for a total dose of 40.05 Gy           2. Left breast boost, 2 Gy in 5 fractions for a total dose of 10 Gy  Beams/energy:   1. 3D, 6X, 10X         2. Electron, 12E  Narrative: The patient tolerated radiation treatment relatively well. At the beginning of her treatments, she noted sharp shooting pains. denied fatigue. On PE, she had slight erythema to left breast, slight nipple inversion to left nipple. Towards the end of her treatment, she noted mild fatigue, rash with hyperigmentation, and itching to breast. She denies pain. On PE, there was diffuse erythema to left breast without skin breakdown.   Plan: The patient has completed radiation treatment. The patient will return to radiation oncology clinic for routine followup in one month. I advised them to call or return sooner if they have any questions or concerns related to their recovery or treatment.  -----------------------------------  Blair Promise, PhD, MD  This document serves as a record of services personally performed by Gery Pray, MD. It was created on his behalf by Vanessa Ralphs, a trained medical scribe. The creation of this record is based on the scribe's personal observations and the provider's statements to them. This document has been checked and approved by the attending provider.

## 2017-11-28 ENCOUNTER — Telehealth: Payer: Self-pay | Admitting: Hematology and Oncology

## 2017-11-28 ENCOUNTER — Encounter: Payer: Self-pay | Admitting: Hematology and Oncology

## 2017-11-28 NOTE — Telephone Encounter (Signed)
Tc made to the pt to schedule a medical oncology appt. Pt has been referred by Dr. Sondra Come in [place] for DCIS. Pt has been scheduled to see Dr. Lindi Adie on 9/3 at 1pm. I offered an earlier appt date and time, but pt declined. Address verified and letter with appt information has been mailed to the pt.

## 2017-12-05 ENCOUNTER — Ambulatory Visit
Admission: RE | Admit: 2017-12-05 | Discharge: 2017-12-05 | Disposition: A | Discharge status: Home / Self Care | Payer: Medicare Other | Source: Ambulatory Visit | Attending: Radiation Oncology | Admitting: Radiation Oncology

## 2017-12-05 ENCOUNTER — Encounter: Payer: Self-pay | Admitting: Radiation Oncology

## 2017-12-05 ENCOUNTER — Other Ambulatory Visit: Payer: Self-pay

## 2017-12-05 VITALS — BP 139/71 | HR 56 | Temp 98.2°F | Resp 18 | Ht 64.0 in | Wt 174.2 lb

## 2017-12-05 DIAGNOSIS — Z881 Allergy status to other antibiotic agents status: Secondary | ICD-10-CM | POA: Diagnosis not present

## 2017-12-05 DIAGNOSIS — Z886 Allergy status to analgesic agent status: Secondary | ICD-10-CM | POA: Diagnosis not present

## 2017-12-05 DIAGNOSIS — Z79899 Other long term (current) drug therapy: Secondary | ICD-10-CM | POA: Insufficient documentation

## 2017-12-05 DIAGNOSIS — D0512 Intraductal carcinoma in situ of left breast: Secondary | ICD-10-CM | POA: Insufficient documentation

## 2017-12-05 DIAGNOSIS — Z171 Estrogen receptor negative status [ER-]: Secondary | ICD-10-CM | POA: Insufficient documentation

## 2017-12-05 DIAGNOSIS — Z882 Allergy status to sulfonamides status: Secondary | ICD-10-CM | POA: Diagnosis not present

## 2017-12-05 NOTE — Progress Notes (Signed)
Pt here for a follow-up of left breast cancer. Pt states that she had some sharp pain and sensitivity that lasted 1 day. Pt states mild fatigue and sleeps to help. Pt states that skin is pink and denies having any itching or burning. Pt states that she is still using the radiaplex gel.  BP 139/71 (BP Location: Right Arm, Patient Position: Sitting)   Pulse (!) 56   Temp 98.2 F (36.8 C) (Oral)   Resp 18   Ht 5\' 4"  (1.626 m)   Wt 174 lb 3.2 oz (79 kg)   SpO2 100%   BMI 29.90 kg/m    Wt Readings from Last 3 Encounters:  12/05/17 174 lb 3.2 oz (79 kg)  10/02/17 172 lb 12.8 oz (78.4 kg)  09/03/17 171 lb (77.6 kg)

## 2017-12-05 NOTE — Progress Notes (Signed)
Radiation Oncology         [phone] ________________________________  Name: Stacey Church MRN: [id number]  Date: 12/05/2017  DOB: 1945/06/19  Follow-Up Visit Note  CC: Pomposini, Cherly Anderson, MD  Rolm Bookbinder, MD    ICD-10-CM   1. Breast neoplasm, Tis (DCIS), left D05.12   2. 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 Breast, Left/ 40.05 Gy in 15 fractions of 2.67 Gy/ 3D; 6X, 10X 2. Breast, Left, Boost/ 10 Gy in 5 fractions of 2 Gy/ Electron; 12E  Narrative:  The patient returns today for routine follow-up, accompanied by her husband. She reports fatigue, but she is feeling well overall.                               ALLERGIES:  is allergic to bactrim [sulfamethoxazole-trimethoprim]; macrobid [nitrofurantoin monohyd macro]; and tylenol [acetaminophen].  Meds: Current Outpatient Medications  Medication Sig Dispense Refill  . Calcium Carbonate-Vitamin D (CALTRATE 600+D) 600-400 MG-UNIT per tablet Take 1 tablet by mouth 2 (two) times daily.    . Cetirizine HCl (ZYRTEC ALLERGY) 10 MG CAPS Zyrtec 10 mg capsule  Take 1 capsule every day by oral route.    . cholecalciferol (VITAMIN D) 1000 UNITS tablet Take 1,000 Units by mouth daily.    . Cyanocobalamin (VITAMIN B 12 PO) Take 1 tablet by mouth every morning.    . folic acid (FOLVITE) 660 MCG tablet Take 400 mcg by mouth daily.    Marland Kitchen ibuprofen (ADVIL,MOTRIN) 200 MG tablet Take 400 mg by mouth daily as needed. For pain    . lansoprazole (PREVACID) 15 MG capsule Take 15 mg by mouth daily at 12 noon.    . Omeprazole (PRILOSEC PO) Prilosec OTC  1 qd 20mg     . timolol (TIMOPTIC) 0.5 % ophthalmic solution Place 1 drop into both eyes daily.    . vitamin E 400 UNIT capsule Take 400 Units by mouth daily.     No current facility-administered medications for this encounter.     Physical Findings: The patient is  in no acute distress. Patient is alert and oriented.  height is 5\' 4"  (1.626 m) and weight is 174 lb 3.2 oz (79 kg). Her oral temperature is 98.2 F (36.8 C). Her blood pressure is 139/71 and her pulse is 56 (abnormal). Her respiration is 18 and oxygen saturation is 100%. .  Lungs are clear to auscultation bilaterally. Heart has regular rate and rhythm. No palpable cervical, supraclavicular, or axillary adenopathy. Abdomen soft, non-tender, normal bowel sounds. Left breast: patient continues to have some mild edema in the nipple-areolar   region. No dominant mass appreciated in the breast. No nipple discharge or bleeding.  Lab Findings: Lab Results  Component Value Date   WBC 5.8 12/02/2011   HGB 13.1 12/02/2011   HCT 38.7 12/02/2011   MCV 91.1 12/02/2011   PLT 87 (L) 12/02/2011    Radiographic Findings: No results found.  Impression:  Left Breast DCIS, High Grade, ER-, PR-  No evidence of recurrence on physical exam. The patient is recovering from the effects of radiation.  Plan:  Patient will follow up with radiation oncology in 3 months. Patient has an appointment with Dr. Lindi Adie in 1 month to discuss hormonal therapy. She continues to see Dr. Donne Hazel.  ___________________________________ -----------------------------------  Blair Promise, PhD, MD  This document serves as a record of services personally performed by Gery Pray, MD. It was created on his behalf by Wilburn Mylar, a trained medical scribe. The creation of this record is based on the scribe's personal observations and the provider's statements to them. This document has been checked and approved by the attending provider.

## 2017-12-31 ENCOUNTER — Inpatient Hospital Stay: Payer: Medicare Other | Attending: Hematology and Oncology | Admitting: Hematology and Oncology

## 2017-12-31 DIAGNOSIS — Z171 Estrogen receptor negative status [ER-]: Secondary | ICD-10-CM | POA: Diagnosis not present

## 2017-12-31 DIAGNOSIS — Z923 Personal history of irradiation: Secondary | ICD-10-CM

## 2017-12-31 DIAGNOSIS — D0512 Intraductal carcinoma in situ of left breast: Secondary | ICD-10-CM | POA: Diagnosis present

## 2017-12-31 DIAGNOSIS — Z79899 Other long term (current) drug therapy: Secondary | ICD-10-CM | POA: Diagnosis not present

## 2017-12-31 NOTE — Assessment & Plan Note (Signed)
08/14/2017: Left lumpectomy: High-grade DCIS with calcifications, 0.9 cm, ALH, ER 0%, PR 0%, Tis NX stage 0 Adjuvant radiation therapy 10/27/2017-11/07/2017 Pathology counseling: I discussed the final pathology report of the patient provided  a copy of this report. I discussed the margins  . We also discussed the final staging along with previously performed ER/PR  Testing.  Recommendation: I did not recommend adjuvant antiestrogen therapy because the tumor is ER PR negative.  Surveillance with [place] with annual mammograms and breast exams. I arranged her for follow-up at the survivorship clinic in 1 year.

## 2017-12-31 NOTE — Progress Notes (Signed)
[facility] NOTE  Patient Care Team: Pomposini, Cherly Anderson, MD as PCP - General (Internal Medicine) Lonzo Cloud, MD ([facility]) Dian Queen, MD as Consulting Physician (Obstetrics and Gynecology)  CHIEF COMPLAINTS/PURPOSE OF CONSULTATION:  Newly diagnosed as DCIS  HISTORY OF PRESENTING ILLNESS:  Stacey Church 72 y.o. female is here because of recent diagnosis of left breast DCIS.  Patient had a couple of times nipple discharge of bloody fluid and saw her gynecologist who referred her for mammograms.  Initial mammogram did not reveal any clear-cut etiology.  She then underwent a breast MRI which identified the area of DCIS with biopsy.  She was then seen by Dr. Donne Hazel who performed lumpectomy and detected a grade 3 DCIS that was ER PR negative measuring 0.9 cm.  She was referred to [place] after she completed radiation therapy to discuss adjuvant treatment plan.  She complains that occasionally her breast feels tender and when she exerts the pain gets much worse.  She will discuss with Dr. Donne Hazel today.  I reviewed her records extensively and collaborated the history with the patient.  SUMMARY OF ONCOLOGIC HISTORY:   Ductal carcinoma in situ of left breast   08/14/2017 Surgery    Left lumpectomy: High-grade DCIS with calcifications, 0.9 cm, ALH, ER 0%, PR 0%, Tis NX stage 0     10/07/2017 Genetic Testing    The Breast & Gyn cancers panel + Renal/Urinarty Tract Cancers Panel + Melanoma Panel + Basal Cell Nevus panel was ordered.  BARD1, BRCA1, BRCA2, BRIP1, CDC73, CDH1, CDK4, CDKN1C, CDKN2A (p14ARF), CDKN2A (p16INK4a), CHEK2, DICER1, DIS3L2, EPCAM*, FH, FLCN, GPC3, MET, MLH1, MSH2, MSH6, NBN, NF1, PALB2, PMS2, POT1, PTCH1, PTEN, RAD50, RAD51C, RAD51D, RB1, SDHB, SDHC, SMARCA4, SMARCB1, STK11, SUFU, TP53, TSC1, TSC2, VHL, WT1 The following genes were evaluated for sequence changes only: MITF*  Results: No pathogenic variants identified.  A Variant of Uncertain  Significance (VUS) in the gene SMARCA4 was identified c.1076G>A (p.Arg359Gln).  VUS's should not be used to make medical management decisions.  The date of this test report is 10/07/2017.     10/10/2017 - 11/07/2017 Radiation Therapy    Adjuvant radiation therapy     MEDICAL HISTORY:  Past Medical History:  Diagnosis Date  . Cancer ([place]) 07/2017   left breast cancer  . Chronic back pain   . Diverticular disease   . Family history of breast cancer   . Family history of melanoma   . Family history of squamous cell carcinoma of skin   . GERD (gastroesophageal reflux disease)   . Glaucoma (increased eye pressure)   . Hyperglycemia   . Oncocytoma    of the kidney  . PONV (postoperative nausea and vomiting)   . Seasonal allergies     SURGICAL HISTORY: Past Surgical History:  Procedure Laterality Date  . APPENDECTOMY    . BREAST CYST EXCISION Bilateral    benign  . BREAST LUMPECTOMY WITH RADIOACTIVE SEED LOCALIZATION Left 08/14/2017   Procedure: BREAST LUMPECTOMY WITH RADIOACTIVE SEED LOCALIZATION;  Surgeon: Rolm Bookbinder, MD;  Location: [facility];  Service: General;  Laterality: Left;  . CATARACT EXTRACTION Bilateral    lens inplants  . KIDNEY CYST REMOVAL    . RE-EXCISION OF BREAST LUMPECTOMY Left 09/03/2017   Procedure: RE-EXCISION LEFT BREAST MARGIN;  Surgeon: Rolm Bookbinder, MD;  Location: [facility];  Service: General;  Laterality: Left;  Marland Kitchen VAGINAL HYSTERECTOMY     partial    SOCIAL HISTORY: Social History  Socioeconomic History  . Marital status: Married    Spouse name: Not on file  . Number of children: 2  . Years of education: Not on file  . Highest education level: Not on file  Occupational History  . Occupation: retired  Scientific laboratory technician  . Financial resource strain: Not on file  . Food insecurity:    Worry: Not on file    Inability: Not on file  . Transportation needs:    Medical: Not on file    Non-medical: Not on file   Tobacco Use  . Smoking status: Never Smoker  . Smokeless tobacco: Never Used  Substance and Sexual Activity  . Alcohol use: No  . Drug use: No  . Sexual activity: Not on file  Lifestyle  . Physical activity:    Days per week: Not on file    Minutes per session: Not on file  . Stress: Not on file  Relationships  . Social connections:    Talks on phone: Not on file    Gets together: Not on file    Attends religious service: Not on file    Active member of club or organization: Not on file    Attends meetings of clubs or organizations: Not on file    Relationship status: Not on file  . Intimate partner violence:    Fear of current or ex partner: Not on file    Emotionally abused: Not on file    Physically abused: Not on file    Forced sexual activity: Not on file  Other Topics Concern  . Not on file  Social History Narrative  . Not on file    FAMILY HISTORY: Family History  Problem Relation Age of Onset  . Diabetes Father   . Skin cancer Father 22       squamous cell  . Breast cancer Paternal Aunt 48  . Melanoma Sister 36  . Cancer Maternal Grandmother 60       type unk  . Cancer Paternal Grandmother 52       mouth  . Breast cancer Paternal Aunt 61  . Breast cancer Other 34  . Colon cancer Neg Hx   . Rectal cancer Neg Hx   . Throat cancer Neg Hx     ALLERGIES:  is allergic to bactrim [sulfamethoxazole-trimethoprim]; macrobid [nitrofurantoin monohyd macro]; and tylenol [acetaminophen].  MEDICATIONS:  Current Outpatient Medications  Medication Sig Dispense Refill  . Calcium Carbonate-Vitamin D (CALTRATE 600+D) 600-400 MG-UNIT per tablet Take 1 tablet by mouth 2 (two) times daily.    . Cetirizine HCl (ZYRTEC ALLERGY) 10 MG CAPS Zyrtec 10 mg capsule  Take 1 capsule every day by oral route.    . cholecalciferol (VITAMIN D) 1000 UNITS tablet Take 1,000 Units by mouth daily.    . Cyanocobalamin (VITAMIN B 12 PO) Take 1 tablet by mouth every morning.    . folic acid  (FOLVITE) 527 MCG tablet Take 400 mcg by mouth daily.    Marland Kitchen ibuprofen (ADVIL,MOTRIN) 200 MG tablet Take 400 mg by mouth daily as needed. For pain    . lansoprazole (PREVACID) 15 MG capsule Take 15 mg by mouth daily at 12 noon.    . Omeprazole (PRILOSEC PO) Prilosec OTC  1 qd 32m    . timolol (TIMOPTIC) 0.5 % ophthalmic solution Place 1 drop into both eyes daily.    . vitamin E 400 UNIT capsule Take 400 Units by mouth daily.     No current facility-administered medications for  this visit.     REVIEW OF SYSTEMS:   Constitutional: Denies fevers, chills or abnormal night sweats Eyes: Denies blurriness of vision, double vision or watery eyes Ears, nose, mouth, throat, and face: Denies mucositis or sore throat Respiratory: Denies cough, dyspnea or wheezes Cardiovascular: Denies palpitation, chest discomfort or lower extremity swelling Gastrointestinal:  Denies nausea, heartburn or change in bowel habits Skin: Denies abnormal skin rashes Lymphatics: Denies new lymphadenopathy or easy bruising Neurological:Denies numbness, tingling or new weaknesses Behavioral/Psych: Mood is stable, no new changes  Breast: Recovered from radiation skin changes All other systems were reviewed with the patient and are negative.  PHYSICAL EXAMINATION: ECOG PERFORMANCE STATUS: 1 - Symptomatic but completely ambulatory  Vitals:   12/31/17 1309  BP: 131/82  Pulse: (!) 54  Temp: 97.8 F (36.6 C)  SpO2: 99%   Filed Weights   12/31/17 1309  Weight: 175 lb 12.8 oz (79.7 kg)    GENERAL:alert, no distress and comfortable SKIN: skin color, texture, turgor are normal, no rashes or significant lesions EYES: normal, conjunctiva are pink and non-injected, sclera clear OROPHARYNX:no exudate, no erythema and lips, buccal mucosa, and tongue normal  NECK: supple, thyroid normal size, non-tender, without nodularity LYMPH:  no palpable lymphadenopathy in the cervical, axillary or inguinal LUNGS: clear to auscultation  and percussion with normal breathing effort HEART: regular rate & rhythm and no murmurs and no lower extremity edema ABDOMEN:abdomen soft, non-tender and normal bowel sounds Musculoskeletal:no cyanosis of digits and no clubbing  PSYCH: alert & oriented x 3 with fluent speech NEURO: no focal motor/sensory deficits    LABORATORY DATA:  I have reviewed the data as listed Lab Results  Component Value Date   WBC 5.8 12/02/2011   HGB 13.1 12/02/2011   HCT 38.7 12/02/2011   MCV 91.1 12/02/2011   PLT 87 (L) 12/02/2011   No results found for: NA, K, CL, CO2  RADIOGRAPHIC STUDIES: I have personally reviewed the radiological reports and agreed with the findings in the report.  ASSESSMENT AND PLAN:  Ductal carcinoma in situ of left breast 08/14/2017: Left lumpectomy: High-grade DCIS with calcifications, 0.9 cm, ALH, ER 0%, PR 0%, Tis NX stage 0 Adjuvant radiation therapy 10/27/2017-11/07/2017 Pathology counseling: I discussed the final pathology report of the patient provided  a copy of this report. I discussed the margins  . We also discussed the final staging along with previously performed ER/PR  Testing.  Recommendation: I did not recommend adjuvant antiestrogen therapy because the tumor is ER PR negative.  Surveillance: Because her cancer was not detected by mammogram, she would need an annual MRI exam at least for the next 2 years and after that the less often.  She will also undergo mammograms for surveillance. She wishes to follow with Dr. Donne Hazel for her cancer surveillance along with her gynecologist.  We are happy to see her on an as-needed basis.    All questions were answered. The patient knows to call the clinic with any problems, questions or concerns.    Harriette Ohara, MD 12/31/17

## 2018-01-09 DIAGNOSIS — D0510 Intraductal carcinoma in situ of unspecified breast: Secondary | ICD-10-CM

## 2018-01-09 HISTORY — DX: Intraductal carcinoma in situ of unspecified breast: D05.10

## 2018-03-06 ENCOUNTER — Telehealth: Payer: Self-pay

## 2018-03-06 NOTE — Telephone Encounter (Signed)
Pt left VM requesting return call re: upcoming "reservation" with Dr. Sondra Come. Attempted to contact pt. Left detailed VM with appt details with Dr. Sondra Come on Monday. This RN's direct number left on VM for any additional questions/concerns. Loma Sousa, RN BSN

## 2018-03-10 ENCOUNTER — Ambulatory Visit: Admission: RE | Admit: 2018-03-10 | Payer: Medicare Other | Source: Ambulatory Visit | Admitting: Radiation Oncology

## 2018-08-08 ENCOUNTER — Encounter: Payer: Self-pay | Admitting: Licensed Clinical Social Worker

## 2018-08-08 NOTE — Progress Notes (Signed)
UPDATE: The SMARCA4 c.1076G>A (p.Arg359Gln) VUS has been reclassified to "Likely Benign." The report date is 08/08/2018.

## 2018-08-26 ENCOUNTER — Telehealth: Payer: Self-pay | Admitting: Licensed Clinical Social Worker

## 2018-08-26 NOTE — Telephone Encounter (Signed)
Received email that patient had called wondering about the letter recently sent to her. I returned her call and explained that her VUS had been reclassified. We reviewed her previous genetic testing, what a VUS is, and what it means that hers has been reclassified.

## 2019-02-10 ENCOUNTER — Other Ambulatory Visit: Payer: Self-pay | Admitting: General Surgery

## 2019-02-10 DIAGNOSIS — Z9889 Other specified postprocedural states: Secondary | ICD-10-CM

## 2019-03-05 ENCOUNTER — Other Ambulatory Visit: Payer: Self-pay

## 2019-03-05 ENCOUNTER — Other Ambulatory Visit: Payer: Self-pay | Admitting: General Surgery

## 2019-03-05 ENCOUNTER — Ambulatory Visit
Admission: RE | Admit: 2019-03-05 | Discharge: 2019-03-05 | Disposition: A | Discharge status: Home / Self Care | Payer: Medicare Other | Source: Ambulatory Visit | Attending: General Surgery | Admitting: General Surgery

## 2019-03-05 DIAGNOSIS — Z9889 Other specified postprocedural states: Secondary | ICD-10-CM

## 2019-03-05 DIAGNOSIS — R921 Mammographic calcification found on diagnostic imaging of breast: Secondary | ICD-10-CM

## 2019-03-05 HISTORY — DX: Personal history of irradiation: Z92.3

## 2019-05-19 ENCOUNTER — Telehealth: Payer: Self-pay | Admitting: Hematology and Oncology

## 2019-05-19 NOTE — Telephone Encounter (Signed)
Scheduled appt per 1/18 sch message - unable to reach pt . Left message with appt date and time

## 2019-06-04 ENCOUNTER — Encounter: Payer: Self-pay | Admitting: Gastroenterology

## 2019-06-22 NOTE — Progress Notes (Signed)
Patient Care Team: Pomposini, Cherly Anderson, MD as PCP - General (Internal Medicine) Lonzo Cloud, MD ([facility]) Dian Queen, MD as Consulting Physician (Obstetrics and Gynecology)  DIAGNOSIS:    ICD-10-CM   1. Ductal carcinoma in situ of left breast  D05.12     SUMMARY OF ONCOLOGIC HISTORY: Oncology History  Ductal carcinoma in situ of left breast  08/14/2017 Surgery   Left lumpectomy: High-grade DCIS with calcifications, 0.9 cm, ALH, ER 0%, PR 0%, Tis NX stage 0    10/07/2017 Genetic Testing   The Breast & Gyn cancers panel + Renal/Urinarty Tract Cancers Panel + Melanoma Panel + Basal Cell Nevus panel was ordered.  BARD1, BRCA1, BRCA2, BRIP1, CDC73, CDH1, CDK4, CDKN1C, CDKN2A (p14ARF), CDKN2A (p16INK4a), CHEK2, DICER1, DIS3L2, EPCAM*, FH, FLCN, GPC3, MET, MLH1, MSH2, MSH6, NBN, NF1, PALB2, PMS2, POT1, PTCH1, PTEN, RAD50, RAD51C, RAD51D, RB1, SDHB, SDHC, SMARCA4, SMARCB1, STK11, SUFU, TP53, TSC1, TSC2, VHL, WT1 The following genes were evaluated for sequence changes only: MITF*  Results: No pathogenic variants identified.  A Variant of Uncertain Significance (VUS) in the gene SMARCA4 was identified c.1076G>A (p.Arg359Gln).  VUS's should not be used to make medical management decisions.  The date of this test report is 10/07/2017.    10/10/2017 - 11/07/2017 Radiation Therapy   Adjuvant radiation therapy     CHIEF COMPLIANT: Follow-up of left breast DCIS  INTERVAL HISTORY: Stacey Church is a 74 y.o. with above-mentioned history of left breast DCIS treated with lumpectomy, radiation, and who is currently on surveillance. Mammogram on 03/05/19 showed a group of calcifications in the left breast at the lumpectomy site spanning 0.2cm. She presents to the clinic today for follow-up.  Patient is concerned about the delay in repeating another mammogram.  She wanted to discuss with me about this.  ALLERGIES:  is allergic to bactrim [sulfamethoxazole-trimethoprim]; macrobid [nitrofurantoin  monohyd macro]; and tylenol [acetaminophen].  MEDICATIONS:  Current Outpatient Medications  Medication Sig Dispense Refill  . Calcium Carbonate-Vitamin D (CALTRATE 600+D) 600-400 MG-UNIT per tablet Take 1 tablet by mouth 2 (two) times daily.    . Cetirizine HCl (ZYRTEC ALLERGY) 10 MG CAPS Zyrtec 10 mg capsule  Take 1 capsule every day by oral route.    . cholecalciferol (VITAMIN D) 1000 UNITS tablet Take 1,000 Units by mouth daily.    . Cyanocobalamin (VITAMIN B 12 PO) Take 1 tablet by mouth every morning.    . folic acid (FOLVITE) 213 MCG tablet Take 400 mcg by mouth daily.    Marland Kitchen ibuprofen (ADVIL,MOTRIN) 200 MG tablet Take 400 mg by mouth daily as needed. For pain    . lansoprazole (PREVACID) 15 MG capsule Take 15 mg by mouth daily at 12 noon.    . Omeprazole (PRILOSEC PO) Prilosec OTC  1 qd 31m    . timolol (TIMOPTIC) 0.5 % ophthalmic solution Place 1 drop into both eyes daily.    . vitamin E 400 UNIT capsule Take 400 Units by mouth daily.     No current facility-administered medications for this visit.    PHYSICAL EXAMINATION: ECOG PERFORMANCE STATUS: 1 - Symptomatic but completely ambulatory  Vitals:   06/23/19 1122  BP: 136/77  Pulse: 63  Resp: 17  Temp: 98.5 F (36.9 C)  SpO2: 100%   Filed Weights   06/23/19 1122  Weight: 177 lb 14.4 oz (80.7 kg)    BREAST: No palpable masses or nodules in either right or left breasts. No palpable axillary supraclavicular or infraclavicular adenopathy no breast tenderness or  nipple discharge. (exam performed in the presence of a chaperone)  LABORATORY DATA:  I have reviewed the data as listed No flowsheet data found.  Lab Results  Component Value Date   WBC 5.8 12/02/2011   HGB 13.1 12/02/2011   HCT 38.7 12/02/2011   MCV 91.1 12/02/2011   PLT 87 (L) 12/02/2011    ASSESSMENT & PLAN:  Ductal carcinoma in situ of left breast 08/14/2017: Left lumpectomy: High-grade DCIS with calcifications, 0.9 cm, ALH, ER 0%, PR 0%, Tis NX  stage 0 Adjuvant radiation therapy 10/27/2017-11/07/2017 Did not require antiestrogen therapy since she is ER/PR negative  Breast cancer surveillance: 1.  Breast exam 06/23/2019: Benign 2.  Mammogram 03/05/2019: Left breast heterogeneous calcifications 2 mm at lumpectomy site probably benign.  62-monthfollow-up mammogram recommended which will be done in May 2021.  Patient was concerned about the calcifications and why they have to wait 6 months for rechecking her. I showed the pictures of the mammograms to her and explained to her that these calcifications are very small and not yet rising to the level of concern for biopsy. If the recheck mammogram shows change in the calcifications and most likely they will biopsied.  If they look the same then this would be classified as benign.  I will see her on an as-needed basis.    No orders of the defined types were placed in this encounter.  The patient has a good understanding of the overall plan. she agrees with it. she will call with any problems that may develop before the next visit here.  Total time spent: 20 mins including face to face time and time spent for planning, charting and coordination of care  GNicholas Lose MD 06/23/2019  I, MCloyde ReamsDorshimer, am acting as scribe for Dr. VNicholas Lose  I have reviewed the above documentation for accuracy and completeness, and I agree with the above.

## 2019-06-23 ENCOUNTER — Inpatient Hospital Stay: Payer: Medicare Other | Attending: Hematology and Oncology | Admitting: Hematology and Oncology

## 2019-06-23 ENCOUNTER — Other Ambulatory Visit: Payer: Self-pay

## 2019-06-23 DIAGNOSIS — R921 Mammographic calcification found on diagnostic imaging of breast: Secondary | ICD-10-CM | POA: Diagnosis not present

## 2019-06-23 DIAGNOSIS — D0512 Intraductal carcinoma in situ of left breast: Secondary | ICD-10-CM

## 2019-06-23 DIAGNOSIS — Z923 Personal history of irradiation: Secondary | ICD-10-CM | POA: Diagnosis not present

## 2019-06-23 DIAGNOSIS — Z86000 Personal history of in-situ neoplasm of breast: Secondary | ICD-10-CM | POA: Diagnosis present

## 2019-06-23 NOTE — Assessment & Plan Note (Signed)
08/14/2017: Left lumpectomy: High-grade DCIS with calcifications, 0.9 cm, ALH, ER 0%, PR 0%, Tis NX stage 0 Adjuvant radiation therapy 10/27/2017-11/07/2017 Did not require antiestrogen therapy since she is ER/PR negative  Breast cancer surveillance: 1.  Breast exam 06/23/2019: Benign 2.  Mammogram 03/05/2019: Left breast heterogeneous calcifications 2 mm at lumpectomy site probably benign.  62-month follow-up mammogram recommended which will be done in May 2021.  Return to clinic in 1 year for follow-up

## 2019-06-30 ENCOUNTER — Other Ambulatory Visit: Payer: Self-pay

## 2019-06-30 ENCOUNTER — Ambulatory Visit (INDEPENDENT_AMBULATORY_CARE_PROVIDER_SITE_OTHER): Payer: Medicare Other | Admitting: Gastroenterology

## 2019-06-30 ENCOUNTER — Encounter: Payer: Self-pay | Admitting: Gastroenterology

## 2019-06-30 VITALS — BP 130/70 | HR 73 | Temp 98.4°F | Ht 64.75 in | Wt 174.0 lb

## 2019-06-30 DIAGNOSIS — K5732 Diverticulitis of large intestine without perforation or abscess without bleeding: Secondary | ICD-10-CM

## 2019-06-30 NOTE — Progress Notes (Signed)
[facility] Consult Note:  History: Stacey Church 06/30/2019  Referring provider: Clinton Quant, MD  Reason for consult/chief complaint: Abdominal Pain (onset in early Feb., given cipro and flagyl by PCP)   Subjective  HPI: I last saw Stacey Church August 2018 when she had some intermittent lower abdominal pain, have been evaluated by gynecology, not clear if it was digestive in nature.  She also had some intermittent hemorrhoidal symptoms.  Seen by primary care February 1 with lower abdominal discomfort, passage of clear white mucus without bleeding, reported feelings of low-grade fever as well.  Has been occurring for about a week, and she believes it started after she had eaten a tomato cucumber salad. Reported similar symptoms around the time of 2018 visit with me.  CT abdomen and pelvis 01/28/2019 reportedly showed diverticulosis (routine surveillance CT because of history of partial nephrectomy for renal lesion).  Colonoscopy was noted to be on 12/03/2013 with providers in [place], diverticulosis also noted. PCP treated empirically for diverticulitis with ciprofloxacin and Flagyl for 7 days.  Stacey Church felt completely better about 2 weeks later.  Her bowel habits finally returned to normal for her, which is about 1-3 BMs per day.  She still has no rectal bleeding.  Appetite has remained good and weight stable.  She recalls having no polyps on any of her last 3 colonoscopies, most recently 2015.  She was concerned that another episode had occurred that she felt was quite similar to that in 2018, and wondered what she might do to prevent future episodes. ROS:  Review of Systems  Constitutional: Negative for appetite change and unexpected weight change.  HENT: Negative for mouth sores and voice change.   Eyes: Negative for pain and redness.  Respiratory: Negative for cough and shortness of breath.   Cardiovascular: Negative for chest pain and palpitations.  Genitourinary:  Negative for dysuria and hematuria.  Musculoskeletal: Positive for arthralgias. Negative for myalgias.       Knee pain, recently improved after cortisone shot  Skin: Negative for pallor and rash.  Allergic/Immunologic: Positive for environmental allergies.  Neurological: Negative for weakness and headaches.  Hematological: Negative for adenopathy.     Past Medical History: Past Medical History:  Diagnosis Date  . Breast cancer ([place])   . Chronic back pain   . Chronic kidney disease (CKD)   . Diverticular disease   . Diverticulosis   . Family history of breast cancer   . Family history of melanoma   . Family history of squamous cell carcinoma of skin   . GERD (gastroesophageal reflux disease)   . Glaucoma   . Glaucoma (increased eye pressure)   . Hyperglycemia   . Hyperglycemia   . 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 Oncocytoma    of the kidney  . Osteopenia   . Personal history of radiation therapy   . PONV (postoperative nausea and vomiting)   . Seasonal allergies   . Seasonal allergies      Past Surgical History: Past Surgical History:  Procedure Laterality Date  . APPENDECTOMY    . BREAST BIOPSY  08/14/2017  . BREAST CYST EXCISION Bilateral    benign  . BREAST LUMPECTOMY Left 2019/4  . BREAST LUMPECTOMY WITH RADIOACTIVE SEED LOCALIZATION Left 08/14/2017   Procedure: BREAST LUMPECTOMY WITH RADIOACTIVE SEED LOCALIZATION;  Surgeon: Rolm Bookbinder, MD;  Location: [facility];  Service: General;  Laterality: Left;  . CATARACT EXTRACTION Bilateral  lens inplants  . KIDNEY CYST REMOVAL    . RE-EXCISION OF BREAST LUMPECTOMY Left 09/03/2017   Procedure: RE-EXCISION LEFT BREAST MARGIN;  Surgeon: Rolm Bookbinder, MD;  Location: [facility];  Service: General;  Laterality: Left;  Marland Kitchen VAGINAL HYSTERECTOMY     partial     Family History: Family History  Problem Relation Age of Onset  . Autoimmune disease  Mother   . Diabetes Father   . Skin cancer Father 78       squamous cell  . Breast cancer Paternal Aunt 35  . Melanoma Sister 57  . Cancer Maternal Grandmother 60       type unk  . Cancer Paternal Grandmother 64       mouth  . Breast cancer Paternal Aunt 55  . Breast cancer Other 34  . Colon cancer Neg Hx   . Rectal cancer Neg Hx   . Throat cancer Neg Hx     Social History: Social History   Socioeconomic History  . Marital status: Married    Spouse name: Not on file  . Number of children: 2  . Years of education: Not on file  . Highest education level: Not on file  Occupational History  . Occupation: retired  Tobacco Use  . Smoking status: Never Smoker  . Smokeless tobacco: Never Used  Substance and Sexual Activity  . Alcohol use: No  . Drug use: No  . Sexual activity: Not on file  Other Topics Concern  . Not on file  Social History Narrative  . Not on file   Social Determinants of Health   Financial Resource Strain:   . Difficulty of Paying Living Expenses: Not on file  Food Insecurity:   . Worried About Charity fundraiser in the Last Year: Not on file  . Ran Out of Food in the Last Year: Not on file  Transportation Needs:   . Lack of Transportation (Medical): Not on file  . Lack of Transportation (Non-Medical): Not on file  Physical Activity:   . Days of Exercise per Week: Not on file  . Minutes of Exercise per Session: Not on file  Stress:   . Feeling of Stress : Not on file  Social Connections:   . Frequency of Communication with Friends and Family: Not on file  . Frequency of Social Gatherings with Friends and Family: Not on file  . Attends Religious Services: Not on file  . Active Member of Clubs or Organizations: Not on file  . Attends Archivist Meetings: Not on file  . Marital Status: Not on file   Her husband Stacey Church is also my patient  Allergies: Allergies  Allergen Reactions  . Bactrim [Sulfamethoxazole-Trimethoprim] Nausea And  Vomiting  . Macrobid [Nitrofurantoin Monohyd Macro] Nausea And Vomiting  . Tylenol [Acetaminophen]     Facial flushing from eyes down neck    Outpatient Meds: Current Outpatient Medications  Medication Sig Dispense Refill  . Calcium Carbonate-Vitamin D (CALTRATE 600+D) 600-400 MG-UNIT per tablet Take 1 tablet by mouth 2 (two) times daily.    . Cetirizine HCl (ZYRTEC ALLERGY) 10 MG CAPS Zyrtec 10 mg capsule  Take 1 capsule every day by oral route.    . cholecalciferol (VITAMIN D) 1000 UNITS tablet Take 1,000 Units by mouth daily.    . Cyanocobalamin (VITAMIN B 12 PO) Take 1 tablet by mouth every morning.    . famotidine (PEPCID) 20 MG tablet Take 20 mg by mouth at bedtime.    Marland Kitchen  folic acid (FOLVITE) A999333 MCG tablet Take 400 mcg by mouth daily.    Marland Kitchen ibuprofen (ADVIL,MOTRIN) 200 MG tablet Take 400 mg by mouth daily as needed. For pain    . pantoprazole (PROTONIX) 40 MG tablet Take 40 mg by mouth daily.    . timolol (TIMOPTIC) 0.5 % ophthalmic solution Place 1 drop into both eyes daily.    . vitamin E 400 UNIT capsule Take 400 Units by mouth daily.     No current facility-administered medications for this visit.      ___________________________________________________________________ Objective   Exam:  BP 130/70   Pulse 73   Temp 98.4 F (36.9 C)   Ht 5' 4.75" (1.645 m)   Wt 174 lb (78.9 kg)   BMI 29.18 kg/m  Antalgic gait  General: Well-appearing  Eyes: sclera anicteric, no redness  ENT: oral mucosa moist without lesions, no cervical or supraclavicular lymphadenopathy  CV: RRR without murmur, S1/S2, no JVD, no peripheral edema  Resp: clear to auscultation bilaterally, normal RR and effort noted  GI: soft, no tenderness, with active bowel sounds. No guarding or palpable organomegaly noted.  Skin; warm and dry, no rash or jaundice noted  Neuro: awake, alert and oriented x 3. Normal gross motor function and fluent speech  No data for review Reports of CT and prior  colonoscopy referenced in primary care note as noted above.  Assessment: Encounter Diagnosis  Name Primary?  . Diverticulitis of colon Yes    The recent episode was most likely diverticulitis, resolved after antibiotics.  Episode in 2018 was most likely the same. We discussed how the often quoted reticulitis dietary triggers have no evidence to support them.  However, for some patients it may just be a matter degree.  Therefore, moderation warranted with consumption of food such as the tomato and cucumber salad that she recalls consuming prior to this recent episode. No red flag symptoms or history of polyps, I do not feel she needs a colonoscopy at this point. Plan:  See me as needed.  30 minutes total time, including chart review, in person patient evaluation and documentation  Nelida Meuse III  CC: Referring provider noted above

## 2019-06-30 NOTE — Patient Instructions (Addendum)
If you are age 76 or older, your body mass index should be between 23-30. Your Body mass index is 29.18 kg/m. If this is out of the aforementioned range listed, please consider follow up with your Primary Care Provider.  If you are age 57 or younger, your body mass index should be between 19-25. Your Body mass index is 29.18 kg/m. If this is out of the aformentioned range listed, please consider follow up with your Primary Care Provider.   Follow up as needed.   It was a pleasure to see you today!  Dr. Loletha Carrow

## 2019-09-03 ENCOUNTER — Other Ambulatory Visit: Payer: Self-pay | Admitting: General Surgery

## 2019-09-03 ENCOUNTER — Other Ambulatory Visit: Payer: Self-pay

## 2019-09-03 ENCOUNTER — Ambulatory Visit
Admission: RE | Admit: 2019-09-03 | Discharge: 2019-09-03 | Disposition: A | Discharge status: Home / Self Care | Payer: Medicare Other | Source: Ambulatory Visit | Attending: General Surgery | Admitting: General Surgery

## 2019-09-03 DIAGNOSIS — R928 Other abnormal and inconclusive findings on diagnostic imaging of breast: Secondary | ICD-10-CM

## 2019-09-03 DIAGNOSIS — N6320 Unspecified lump in the left breast, unspecified quadrant: Secondary | ICD-10-CM

## 2019-09-03 DIAGNOSIS — R921 Mammographic calcification found on diagnostic imaging of breast: Secondary | ICD-10-CM

## 2019-09-16 ENCOUNTER — Other Ambulatory Visit: Payer: Self-pay

## 2019-09-16 ENCOUNTER — Ambulatory Visit
Admission: RE | Admit: 2019-09-16 | Discharge: 2019-09-16 | Disposition: A | Discharge status: Home / Self Care | Payer: Medicare Other | Source: Ambulatory Visit | Attending: General Surgery | Admitting: General Surgery

## 2019-09-16 DIAGNOSIS — R928 Other abnormal and inconclusive findings on diagnostic imaging of breast: Secondary | ICD-10-CM

## 2019-12-29 IMAGING — MG MM CLIP PLACEMENT
2 series · 2 of 2 positions shown · non-contrast
Comparison: Previous exam(s).

CLINICAL DATA: Status post MR guided core biopsy of abnormal
enhancement in the left breast.

EXAM:
DIAGNOSTIC LEFT MAMMOGRAM POST MRI BIOPSY

[L ML]
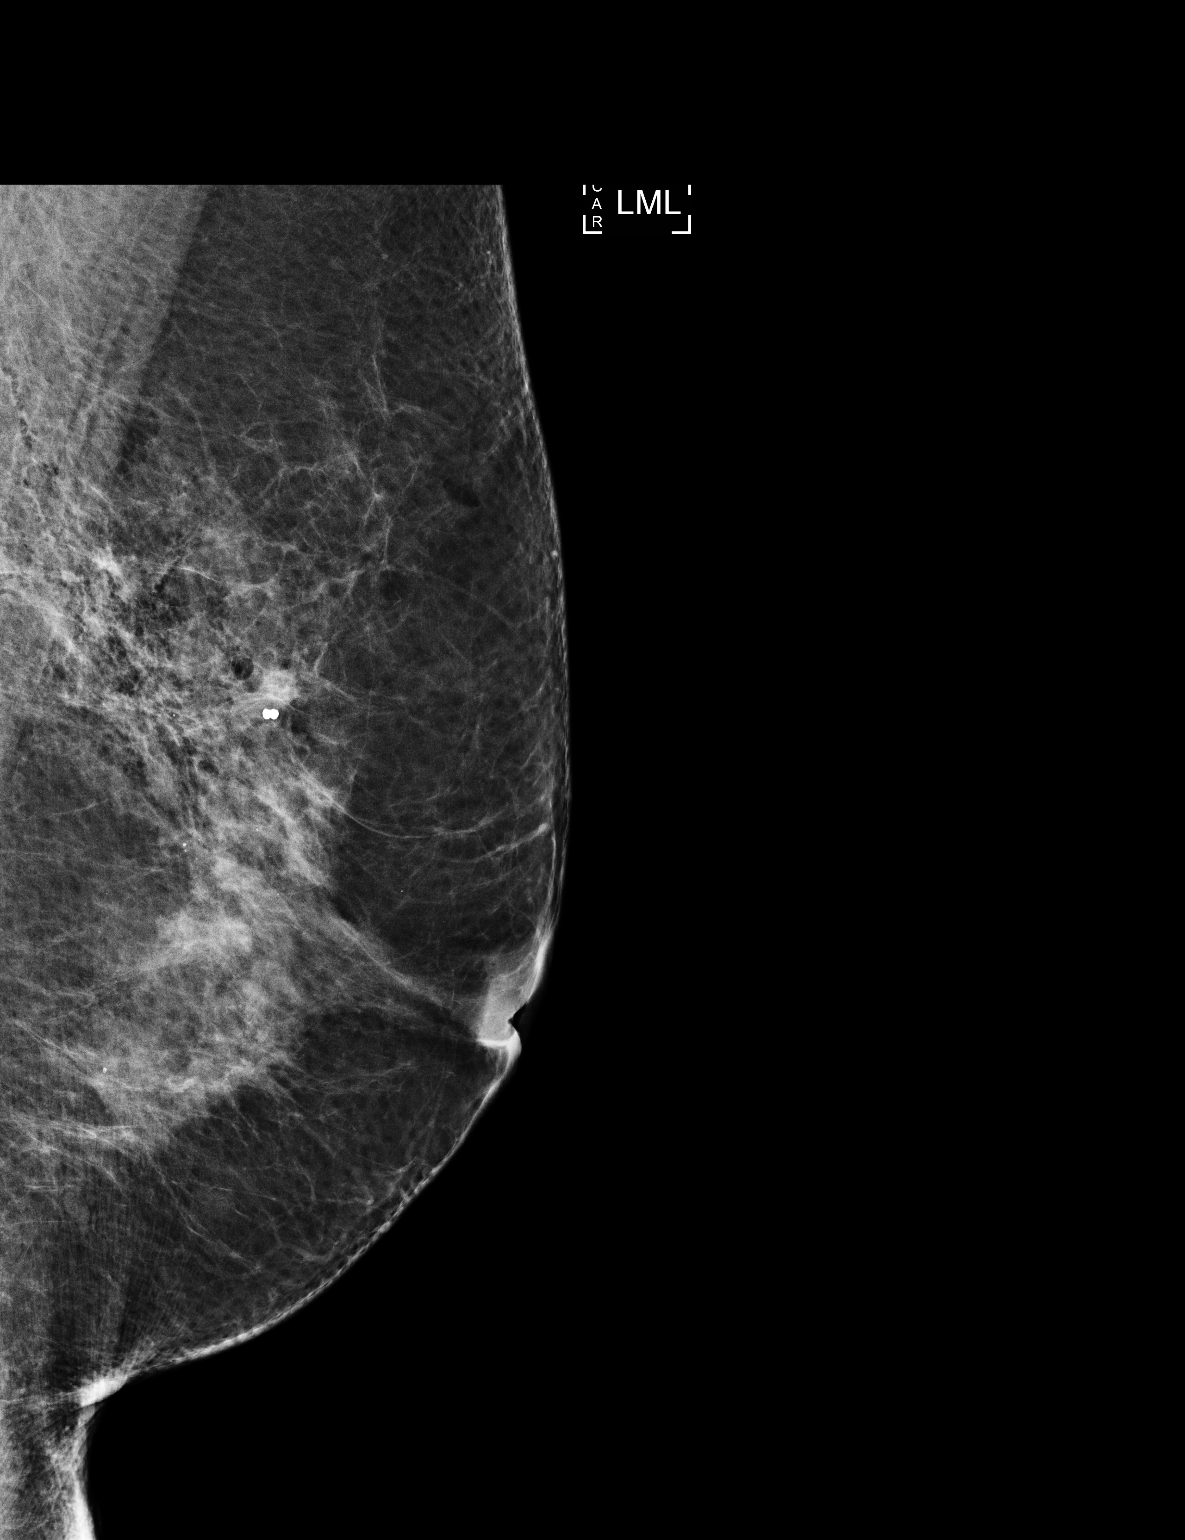

[L CC]
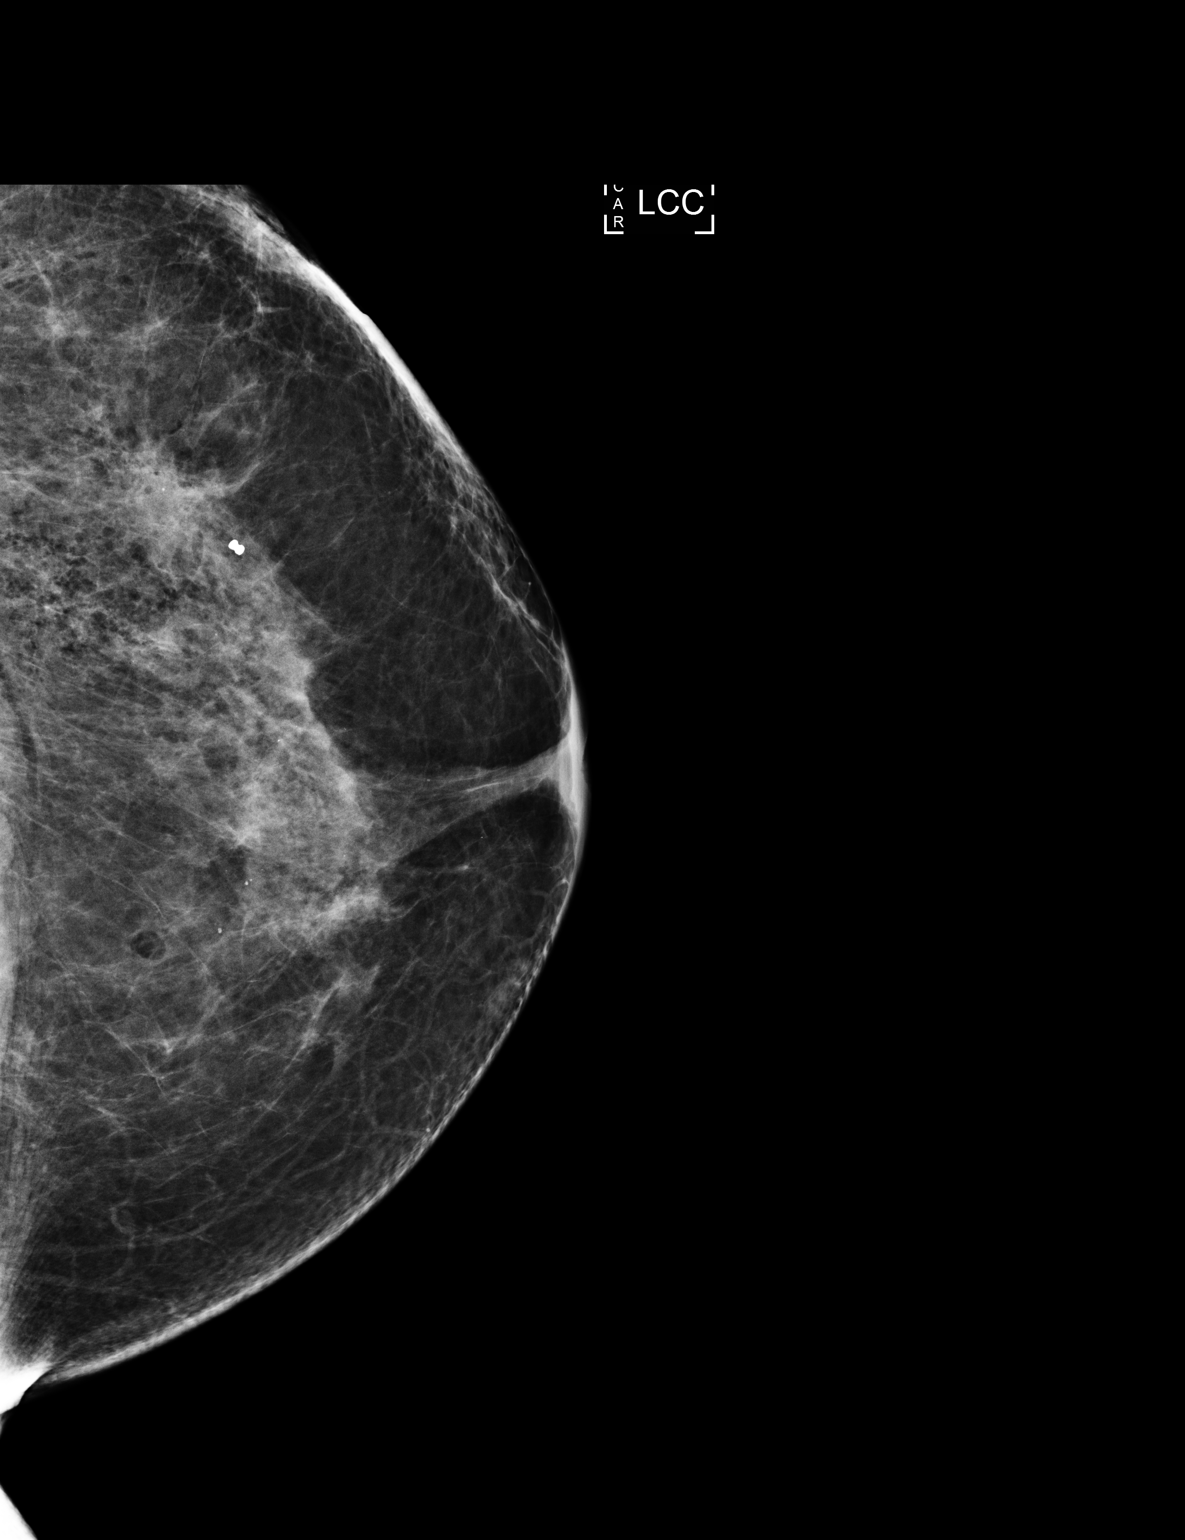

[2 of 2 positions shown; findings below may reference images not displayed]

FINDINGS: Mammographic images were obtained following MR guided biopsy of
abnormal enhancement in the upper-outer quadrant of the left breast.
Mammographic images show there is a dumbbell-shaped clip in the
upper-outer quadrant of the left breast in appropriate position.
IMPRESSION: Status post MR guided core biopsy of the left breast with pathology
pending.

Final Assessment: Post Procedure Mammograms for Marker Placement

## 2020-02-25 ENCOUNTER — Other Ambulatory Visit: Payer: Self-pay | Admitting: General Surgery

## 2020-02-25 DIAGNOSIS — Z9889 Other specified postprocedural states: Secondary | ICD-10-CM

## 2020-03-22 ENCOUNTER — Ambulatory Visit
Admission: RE | Admit: 2020-03-22 | Discharge: 2020-03-22 | Disposition: A | Discharge status: Home / Self Care | Payer: Medicare Other | Source: Ambulatory Visit | Attending: General Surgery | Admitting: General Surgery

## 2020-03-22 ENCOUNTER — Other Ambulatory Visit: Payer: Self-pay | Admitting: General Surgery

## 2020-03-22 ENCOUNTER — Other Ambulatory Visit: Payer: Self-pay

## 2020-03-22 DIAGNOSIS — Z9889 Other specified postprocedural states: Secondary | ICD-10-CM

## 2020-03-30 HISTORY — PX: BREAST BIOPSY: SHX20

## 2020-04-05 ENCOUNTER — Ambulatory Visit
Admission: RE | Admit: 2020-04-05 | Discharge: 2020-04-05 | Disposition: A | Discharge status: Home / Self Care | Payer: Medicare Other | Source: Ambulatory Visit | Attending: General Surgery | Admitting: General Surgery

## 2020-04-05 ENCOUNTER — Other Ambulatory Visit: Payer: Self-pay

## 2020-04-05 ENCOUNTER — Other Ambulatory Visit (HOSPITAL_COMMUNITY): Payer: Self-pay | Admitting: Diagnostic Radiology

## 2020-04-05 ENCOUNTER — Other Ambulatory Visit: Payer: Self-pay | Admitting: General Surgery

## 2020-04-05 DIAGNOSIS — R928 Other abnormal and inconclusive findings on diagnostic imaging of breast: Secondary | ICD-10-CM

## 2020-04-05 DIAGNOSIS — Z9889 Other specified postprocedural states: Secondary | ICD-10-CM

## 2021-03-06 ENCOUNTER — Other Ambulatory Visit: Payer: Self-pay | Admitting: General Surgery

## 2021-03-06 DIAGNOSIS — Z87898 Personal history of other specified conditions: Secondary | ICD-10-CM

## 2021-03-06 DIAGNOSIS — Z1231 Encounter for screening mammogram for malignant neoplasm of breast: Secondary | ICD-10-CM

## 2021-03-31 ENCOUNTER — Other Ambulatory Visit: Payer: Self-pay | Admitting: General Surgery

## 2021-03-31 DIAGNOSIS — Z9889 Other specified postprocedural states: Secondary | ICD-10-CM

## 2021-04-05 ENCOUNTER — Ambulatory Visit
Admission: RE | Admit: 2021-04-05 | Discharge: 2021-04-05 | Disposition: A | Discharge status: Home / Self Care | Payer: Medicare Other | Source: Ambulatory Visit | Attending: General Surgery | Admitting: General Surgery

## 2021-04-05 DIAGNOSIS — Z9889 Other specified postprocedural states: Secondary | ICD-10-CM

## 2021-06-29 ENCOUNTER — Other Ambulatory Visit: Payer: Self-pay | Admitting: Obstetrics and Gynecology

## 2021-06-29 DIAGNOSIS — N644 Mastodynia: Secondary | ICD-10-CM

## 2021-07-17 ENCOUNTER — Ambulatory Visit
Admission: RE | Admit: 2021-07-17 | Discharge: 2021-07-17 | Disposition: A | Discharge status: Home / Self Care | Payer: Medicare Other | Source: Ambulatory Visit | Attending: Obstetrics and Gynecology | Admitting: Obstetrics and Gynecology

## 2021-07-17 ENCOUNTER — Ambulatory Visit: Payer: Medicare Other

## 2021-07-17 ENCOUNTER — Other Ambulatory Visit: Payer: Self-pay

## 2021-07-17 DIAGNOSIS — N644 Mastodynia: Secondary | ICD-10-CM

## 2021-08-16 ENCOUNTER — Telehealth: Payer: Self-pay

## 2021-08-16 NOTE — Telephone Encounter (Signed)
Notes scanned to referral 

## 2021-08-24 ENCOUNTER — Ambulatory Visit (INDEPENDENT_AMBULATORY_CARE_PROVIDER_SITE_OTHER): Payer: Medicare Other | Admitting: Internal Medicine

## 2021-08-24 ENCOUNTER — Encounter: Payer: Self-pay | Admitting: Internal Medicine

## 2021-08-24 ENCOUNTER — Ambulatory Visit (INDEPENDENT_AMBULATORY_CARE_PROVIDER_SITE_OTHER): Payer: Medicare Other

## 2021-08-24 VITALS — BP 150/81 | HR 62

## 2021-08-24 DIAGNOSIS — R002 Palpitations: Secondary | ICD-10-CM | POA: Diagnosis not present

## 2021-08-24 DIAGNOSIS — I480 Paroxysmal atrial fibrillation: Secondary | ICD-10-CM | POA: Diagnosis not present

## 2021-08-24 DIAGNOSIS — R9431 Abnormal electrocardiogram [ECG] [EKG]: Secondary | ICD-10-CM

## 2021-08-24 NOTE — Progress Notes (Signed)
?Cardiology Office Note:   ? ?Date:  08/24/2021  ? ?ID:  Stacey Church, DOB Apr 21, 1946, MRN [id number] ? ?PCP:  Pomposini, Cherly Anderson, MD ?  ?[facility] Providers ?Cardiologist:  None    ? ?Referring MD: Pomposini, Cherly Anderson, MD  ? ?No chief complaint on file. ??Atrial Fibrillation ? ?History of Present Illness:   ? ?Stacey Church is a 76 y.o. female with a hx of DCIS of the L breast, CKD, referral for ?atrial fibrillation ? ? ?She stated she was diagnosis in December 2022 of afib. She went to an ED at [facility] in [place], [place]. She noted she saw a "rhythm specialist", but she was not started on any medications. I don't have records ? ?Blood pressure was high today, not typically this elevated. ? ?Pending TSH from her PCP. ? 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 ?Past Medical History:  ?Diagnosis Date  ? Breast cancer ([place]) 2019  ? left breast  ? Chronic back pain   ? Chronic kidney disease (CKD)   ? Diverticular disease   ? Diverticulosis   ? Family history of breast cancer   ? Family history of melanoma   ? Family history of squamous cell carcinoma of skin   ? GERD (gastroesophageal reflux disease)   ? Glaucoma   ? Glaucoma (increased eye pressure)   ? Hyperglycemia   ? Hyperglycemia   ? 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 Left breast  ? Oncocytoma   ? of the kidney  ? Osteopenia   ? Personal history of radiation therapy   ? PONV (postoperative nausea and vomiting)   ? Seasonal allergies   ? Seasonal allergies   ? ? ?Past Surgical History:  ?Procedure Laterality Date  ? APPENDECTOMY    ? BREAST BIOPSY  08/14/2017  ? pos  ? BREAST BIOPSY Left 03/2020  ? neg  ? BREAST CYST EXCISION Bilateral   ? benign  ? BREAST LUMPECTOMY Left 2019/4  ? BREAST LUMPECTOMY WITH RADIOACTIVE SEED LOCALIZATION Left 08/14/2017  ? Procedure: BREAST LUMPECTOMY WITH RADIOACTIVE SEED LOCALIZATION;  Surgeon: Rolm Bookbinder, MD;  Location: [facility];  Service: General;  Laterality: Left;  ? CATARACT EXTRACTION Bilateral   ? lens inplants  ? KIDNEY CYST REMOVAL    ? RE-EXCISION OF BREAST LUMPECTOMY Left 09/03/2017  ? Procedure: RE-EXCISION LEFT BREAST MARGIN;  Surgeon: Rolm Bookbinder, MD;  Location: [facility];  Service: General;  Laterality: Left;  ? VAGINAL HYSTERECTOMY    ? partial  ? ? ?Current Medications: ?Current Meds  ?Medication Sig  ? brimonidine (ALPHAGAN) 0.2 % ophthalmic solution Place 1 drop into both eyes in the morning and at bedtime.  ? Calcium Carbonate-Vitamin D 600-400 MG-UNIT tablet Take 1 tablet by mouth 2 (two) times daily.  ? Cetirizine HCl 10 MG CAPS Zyrtec 10 mg capsule ? Take 1 capsule every day by oral route.  ? cholecalciferol (VITAMIN D) 1000 UNITS tablet Take 1,000 Units by mouth daily.  ? Cyanocobalamin (VITAMIN B 12 PO) Take 1 tablet by mouth every morning.  ? famotidine (PEPCID) 20 MG tablet Take 20 mg by mouth at bedtime.  ? folic acid (FOLVITE) 676 MCG tablet Take 400 mcg by mouth daily.  ? ibuprofen (ADVIL,MOTRIN) 200 MG tablet Take 400 mg by mouth daily as needed. For pain  ? pantoprazole (PROTONIX) 40 MG tablet  Take 40 mg by mouth daily.  ? vitamin E 400 UNIT capsule Take 400 Units by mouth daily.  ? [DISCONTINUED] timolol (TIMOPTIC) 0.5 % ophthalmic solution Place 1 drop into both eyes daily.  ?  ? ?Allergies:   Atorvastatin, Cefdinir, Clarithromycin, Macrobid [nitrofurantoin monohyd macro], Pollen extract, Sulfa antibiotics, Sulfamethoxazole-trimethoprim, and Tylenol [acetaminophen]  ? ?Social History  ? ?Socioeconomic History  ? Marital status: Married  ?  Spouse name: Not on file  ? Number of children: 2  ? Years of education: Not on file  ? Highest education level: Not on file  ?Occupational History  ? Occupation: retired  ?Tobacco Use  ? Smoking status: Never  ? Smokeless tobacco: Never  ?Vaping Use  ? Vaping Use: Never used  ?Substance and Sexual  Activity  ? Alcohol use: No  ? Drug use: No  ? Sexual activity: Not on file  ?Other Topics Concern  ? Not on file  ?Social History Narrative  ? Not on file  ? ?Social Determinants of Health  ? ?Financial Resource Strain: Not on file  ?Food Insecurity: Not on file  ?Transportation Needs: Not on file  ?Physical Activity: Not on file  ?Stress: Not on file  ?Social Connections: Not on file  ?  ? ?Family History: ?The patient's family history includes Autoimmune disease in her mother; Breast cancer (age of onset: 32) in an other family member; Breast cancer (age of onset: 33) in her paternal aunt; Breast cancer (age of onset: 87) in her paternal aunt; Cancer (age of onset: 50) in her paternal grandmother; Cancer (age of onset: 12) in her maternal grandmother; Diabetes in her father; Melanoma (age of onset: 56) in her sister; Skin cancer (age of onset: 58) in her father. There is no history of Colon cancer, Rectal cancer, or Throat cancer. ? ?ROS:   ?Please see the history of present illness.    ? All other systems reviewed and are negative. ? ?EKGs/Labs/Other Studies Reviewed:   ? ?The following studies were reviewed today: ? ? ?EKG:  EKG is  ordered today.  The ekg ordered today demonstrates  ? ?EKG 08/24/2021: sinus rhythm and PACs ? ?Recent Labs: ?No results found for requested labs within last 8760 hours.  ?Recent Lipid Panel ?No results found for: CHOL, TRIG, HDL, CHOLHDL, VLDL, LDLCALC, LDLDIRECT ? ? ?Risk Assessment/Calculations:   ?  ? ?    ? ?Physical Exam:   ? ?VS:   ? ?Vitals:  ? 08/24/21 1321  ?BP: (!) 150/81  ?Pulse: 62  ?SpO2: 100%  ? ? ? ?Wt Readings from Last 3 Encounters:  ?06/30/19 174 lb (78.9 kg)  ?06/23/19 177 lb 14.4 oz (80.7 kg)  ?12/31/17 175 lb 12.8 oz (79.7 kg)  ?  ? ?GEN:  Well nourished, well developed in no acute distress ?HEENT: Normal ?NECK: No JVD; No carotid bruits ?LYMPHATICS: No lymphadenopathy ?CARDIAC: RRR, no murmurs, rubs, gallops ?RESPIRATORY:  Clear to auscultation without rales,  wheezing or rhonchi  ?ABDOMEN: Soft, non-tender, non-distended ?MUSCULOSKELETAL:  No edema; No deformity  ?SKIN: Warm and dry ?NEUROLOGIC:  Alert and oriented x 3 ?PSYCHIATRIC:  Normal affect  ? ?ASSESSMENT:   ? ?#?Arrhythmia: I don't have any records or an EKG demonstrating atrial fibrillation. Today she is in sinus rhythm with PACs. This is benign. Will get a zio patch to assess for afib before making recommendations or changes in her medications. TSH pending from her PCP. No hx of DM2 or stroke. ? ? ?PLAN:   ? ?In  order of problems listed above: ? ?Zio patch 2 weeks  ?TTE ?Follow up in 3 months ? ?   ? ?  ?Medication Adjustments/Labs and Tests Ordered: ?Current medicines are reviewed at length with the patient today.  Concerns regarding medicines are outlined above.  ?Orders Placed This Encounter  ?Procedures  ? LONG TERM MONITOR (3-14 DAYS)  ? EKG 12-Lead  ? ECHOCARDIOGRAM COMPLETE  ? ?No orders of the defined types were placed in this encounter. ? ? ?Patient Instructions  ?Medication Instructions:  ?Your physician recommends that you continue on your current medications as directed. Please refer to the Current Medication list given to you today. ? ?*If you need a refill on your cardiac medications before your next appointment, please call your pharmacy* ? ? ?Testing/Procedures: ?Your physician has requested that you have an echocardiogram. Echocardiography is a painless test that uses sound waves to create images of your heart. It provides your doctor with information about the size and shape of your heart and how well your heart?s chambers and valves are working. This procedure takes approximately one hour. There are no restrictions for this procedure. ?-- done at [facility] ([address] [place]) OR [facility] ([address] [place], [place] [address]) ? ?ZIO XT- Long Term Monitor Instructions ? ?Your physician has requested you wear a ZIO patch monitor for 14 days.   ?This is a single patch monitor. [employer] supplies one patch monitor per enrollment. Additional ?stickers are not available. Please do not apply patch if you will be having a Nuclear Stress Test,  ?Echocard

## 2021-08-24 NOTE — Progress Notes (Unsigned)
Enrolled patient for a 14 day Zio XT  monitor to be mailed to patients home  °

## 2021-08-24 NOTE — Patient Instructions (Signed)
Medication Instructions:  ?Your physician recommends that you continue on your current medications as directed. Please refer to the Current Medication list given to you today. ? ?*If you need a refill on your cardiac medications before your next appointment, please call your pharmacy* ? ? ?Testing/Procedures: ?Your physician has requested that you have an echocardiogram. Echocardiography is a painless test that uses sound waves to create images of your heart. It provides your doctor with information about the size and shape of your heart and how well your heart?s chambers and valves are working. This procedure takes approximately one hour. There are no restrictions for this procedure. ?-- done at [facility] ([address] [place]) OR [facility] ([address] [place], [place] [address]) ? ?ZIO XT- Long Term Monitor Instructions ? ?Your physician has requested you wear a ZIO patch monitor for 14 days.  ?This is a single patch monitor. [employer] supplies one patch monitor per enrollment. Additional ?stickers are not available. Please do not apply patch if you will be having a Nuclear Stress Test,  ?Echocardiogram, Cardiac CT, MRI, or Chest Xray during the period you would be wearing the  ?monitor. The patch cannot be worn during these tests. You cannot remove and re-apply the  ?ZIO XT patch monitor.  ?Your ZIO patch monitor will be mailed 3 day USPS to your address on file. It may take 3-5 days  ?to receive your monitor after you have been enrolled.  ?Once you have received your monitor, please review the enclosed instructions. Your monitor  ?has already been registered assigning a specific monitor serial # to you. ? ?Billing and Patient Assistance Program Information ? ?We have supplied [employer] with any of your insurance information on file for billing purposes. ?[employer] offers a sliding scale Patient Assistance Program for patients that do not have  ?insurance, or whose  insurance does not completely cover the cost of the ZIO monitor.  ?You must apply for the Patient Assistance Program to qualify for this discounted rate.  ?To apply, please call [employer] at [phone], select option 4, select option 2, ask to apply for  ?Patient Assistance Program. Theodore Demark will ask your household income, and how many people  ?are in your household. They will quote your out-of-pocket cost based on that information.  ?[employer] will also be able to set up a 38-month interest-free payment plan if needed. ? ?Applying the monitor ?  ?Shave hair from upper left chest.  ?Hold abrader disc by orange tab. Rub abrader in 40 strokes over the upper left chest as  ?indicated in your monitor instructions.  ?Clean area with 4 enclosed alcohol pads. Let dry.  ?Apply patch as indicated in monitor instructions. Patch will be placed under collarbone on left  ?side of chest with arrow pointing upward.  ?Rub patch adhesive wings for 2 minutes. Remove white label marked "1". Remove the white  ?label marked "2". Rub patch adhesive wings for 2 additional minutes.  ?While looking in a mirror, press and release button in center of patch. A small green light will  ?flash 3-4 times. This will be your only indicator that the monitor has been turned on.  ?Do not shower for the first 24 hours. You may shower after the first 24 hours.  ?Press the button if you feel a symptom. You will hear a small click. Record Date, Time and  ?Symptom in the Patient Logbook.  ?When you are ready to remove the patch, follow instructions on the last  2 pages of Patient  ?Logbook. Stick patch monitor onto the last page of Patient Logbook.  ?Place Patient Logbook in the blue and white box. Use locking tab on box and tape box closed  ?securely. The blue and white box has prepaid postage on it. Please place it in the mailbox as  ?soon as possible. Your physician should have your test results approximately 7 days after the  ?monitor has been mailed back  to [facility].  ?Call [facility] at [phone] if you have questions regarding  ?your ZIO XT patch monitor. Call them immediately if you see an orange light blinking on your  ?monitor.  ?If your monitor falls off in less than 4 days, contact our Monitor department at [phone].  ?If your monitor becomes loose or falls off after 4 days call [employer] at [phone] for  ?suggestions on securing your monitor ? ? ?Follow-Up: ?At [facility], you and your health needs are our priority.  As part of our continuing mission to provide you with exceptional heart care, we have created designated Provider Care Teams.  These Care Teams include your primary Cardiologist (physician) and Advanced Practice Providers (APPs -  Physician Assistants and Nurse Practitioners) who all work together to provide you with the care you need, when you need it. ? ?We recommend signing up for the patient portal called "MyChart".  Sign up information is provided on this After Visit Summary.  MyChart is used to connect with patients for Virtual Visits (Telemedicine).  Patients are able to view lab/test results, encounter notes, upcoming appointments, etc.  Non-urgent messages can be sent to your provider as well.   ?To learn more about what you can do with MyChart, go to [email or web address].   ? ?Your next appointment:   ?3 month(s) ? ?The format for your next appointment:   ?In Person ? ?Provider:   ?Dr. Phineas Inches ? ?Important Information About Sugar ? ? ? ? ? ? ?

## 2021-08-26 DIAGNOSIS — R9431 Abnormal electrocardiogram [ECG] [EKG]: Secondary | ICD-10-CM | POA: Diagnosis not present

## 2021-08-26 DIAGNOSIS — R002 Palpitations: Secondary | ICD-10-CM | POA: Diagnosis not present

## 2021-09-07 ENCOUNTER — Ambulatory Visit (HOSPITAL_COMMUNITY): Payer: Medicare Other | Attending: Cardiology

## 2021-09-07 DIAGNOSIS — R9431 Abnormal electrocardiogram [ECG] [EKG]: Secondary | ICD-10-CM | POA: Insufficient documentation

## 2021-09-07 LAB — ECHOCARDIOGRAM COMPLETE
Area-P 1/2: 3.28 cm2
P 1/2 time: 691 msec
S' Lateral: 2.9 cm

## 2021-11-23 ENCOUNTER — Encounter: Payer: Self-pay | Admitting: Internal Medicine

## 2021-11-23 ENCOUNTER — Ambulatory Visit (INDEPENDENT_AMBULATORY_CARE_PROVIDER_SITE_OTHER): Payer: Medicare Other | Admitting: Internal Medicine

## 2021-11-23 VITALS — BP 122/68 | HR 56 | Ht 64.0 in | Wt 175.2 lb

## 2021-11-23 DIAGNOSIS — R002 Palpitations: Secondary | ICD-10-CM | POA: Diagnosis not present

## 2021-11-23 NOTE — Progress Notes (Signed)
Cardiology Office Note:    Date:  11/23/2021   ID:  Stacey Church, DOB Sep 11, 1945, MRN [id number]  PCP:  Clinton Quant, MD   [facility] Providers Cardiologist:  Janina Mayo, MD     Referring MD: Clinton Quant, MD   No chief complaint on file. ?Atrial Fibrillation  History of Present Illness:    Stacey Church is a 76 y.o. female with a hx of DCIS of the L breast, CKD, referral for ?atrial fibrillation   She stated she was diagnosis in December 2022 of afib. She went to an ED at [facility] in [place], [place]. She noted she saw a "rhythm specialist", but she was not started on any medications. I don't have records  Blood pressure was high today, not typically this elevated.  Pending TSH from her PCP.  Interim Hx: She had  zio which showed PACs which is benign. Mild runs of PAT. No afib. Her echo was unremarkable. Her smartwatch indicates that she has afib; we discussed this is an error. 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 s/p lumpectomy and XRT ECOG 1 No chemotherapy  Past Medical History:  Diagnosis Date   Breast cancer ([place]) 2019   left breast   Chronic back pain    Chronic kidney disease (CKD)    Diverticular disease    Diverticulosis    Family history of breast cancer    Family history of melanoma    Family history of squamous cell carcinoma of skin    GERD (gastroesophageal reflux disease)    Glaucoma    Glaucoma (increased eye pressure)    Hyperglycemia    Hyperglycemia    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   Oncocytoma    of the kidney   Osteopenia    Personal history of radiation therapy    PONV (postoperative nausea and vomiting)    Seasonal allergies    Seasonal allergies     Past Surgical History:  Procedure Laterality Date   APPENDECTOMY     BREAST BIOPSY  08/14/2017   pos   BREAST  BIOPSY Left 03/2020   neg   BREAST CYST EXCISION Bilateral    benign   BREAST LUMPECTOMY Left 2019/4   BREAST LUMPECTOMY WITH RADIOACTIVE SEED LOCALIZATION Left 08/14/2017   Procedure: BREAST LUMPECTOMY WITH RADIOACTIVE SEED LOCALIZATION;  Surgeon: Rolm Bookbinder, MD;  Location: [place];  Service: General;  Laterality: Left;   CATARACT EXTRACTION Bilateral    lens inplants   KIDNEY CYST REMOVAL     RE-EXCISION OF BREAST LUMPECTOMY Left 09/03/2017   Procedure: RE-EXCISION LEFT BREAST MARGIN;  Surgeon: Rolm Bookbinder, MD;  Location: [place];  Service: General;  Laterality: Left;   VAGINAL HYSTERECTOMY     partial    Current Medications: Current Meds  Medication Sig   Calcium Carbonate-Vitamin D 600-400 MG-UNIT tablet Take 1 tablet by mouth 2 (two) times daily.   Cetirizine HCl 10 MG CAPS Zyrtec 10 mg capsule  Take 1 capsule every day by oral route.   cholecalciferol (VITAMIN D) 1000 UNITS tablet Take 1,000 Units by mouth daily.   Cyanocobalamin (VITAMIN B 12 PO) Take 1 tablet by mouth every morning.   famotidine (PEPCID) 20 MG tablet Take 20 mg by mouth at bedtime.   folic acid (FOLVITE) 431 MCG tablet Take  400 mcg by mouth daily.   ibuprofen (ADVIL,MOTRIN) 200 MG tablet Take 400 mg by mouth daily as needed. For pain   pantoprazole (PROTONIX) 40 MG tablet Take 40 mg by mouth daily.   vitamin E 400 UNIT capsule Take 400 Units by mouth daily.     Allergies:   Atorvastatin, Cefdinir, Clarithromycin, Macrobid [nitrofurantoin monohyd macro], Pollen extract, Sulfa antibiotics, Sulfamethoxazole-trimethoprim, and Tylenol [acetaminophen]   Social History   Socioeconomic History   Marital status: Married    Spouse name: Not on file   Number of children: 2   Years of education: Not on file   Highest education level: Not on file  Occupational History   Occupation: retired  Tobacco Use   Smoking status: Never   Smokeless tobacco: Never   Vaping Use   Vaping Use: Never used  Substance and Sexual Activity   Alcohol use: No   Drug use: No   Sexual activity: Not on file  Other Topics Concern   Not on file  Social History Narrative   Not on file   Social Determinants of Health   Financial Resource Strain: Not on file  Food Insecurity: Not on file  Transportation Needs: Not on file  Physical Activity: Not on file  Stress: Not on file  Social Connections: Not on file     Family History: The patient's family history includes Autoimmune disease in her mother; Breast cancer (age of onset: 38) in an other family member; Breast cancer (age of onset: 11) in her paternal aunt; Breast cancer (age of onset: 29) in her paternal aunt; Cancer (age of onset: 44) in her paternal grandmother; Cancer (age of onset: 28) in her maternal grandmother; Diabetes in her father; Melanoma (age of onset: 1) in her sister; Skin cancer (age of onset: 7) in her father. There is no history of Colon cancer, Rectal cancer, or Throat cancer.  ROS:   Please see the history of present illness.     All other systems reviewed and are negative.  EKGs/Labs/Other Studies Reviewed:    The following studies were reviewed today:   EKG:  EKG is  ordered today.  The ekg ordered today demonstrates   EKG 08/24/2021: sinus rhythm and PACs  Recent Labs: No results found for requested labs within last 365 days.  Recent Lipid Panel No results found for: "CHOL", "TRIG", "HDL", "CHOLHDL", "VLDL", "LDLCALC", "LDLDIRECT"   Risk Assessment/Calculations:           Physical Exam:    VS:    Vitals:   11/23/21 0951  BP: 122/68  Pulse: (!) 56  SpO2: 98%      Wt Readings from Last 3 Encounters:  11/23/21 175 lb 3.2 oz (79.5 kg)  06/30/19 174 lb (78.9 kg)  06/23/19 177 lb 14.4 oz (80.7 kg)     GEN:  Well nourished, well developed in no acute distress HEENT: Normal NECK: No JVD;  LYMPHATICS: No lymphadenopathy CARDIAC: RRR, no murmurs, rubs,  gallops RESPIRATORY:  Clear to auscultation without rales, wheezing or rhonchi  ABDOMEN: Soft, non-tender, non-distended MUSCULOSKELETAL:  No edema; No deformity  SKIN: Warm and dry NEUROLOGIC:  Alert and oriented x 3 PSYCHIATRIC:  Normal affect   ASSESSMENT:    #PACs: this is benign. No further work up indicated at this time. We discussed that this is not afib despite what her smart watch says. We also discussed signs of atrial fibrillation including LH , persistent palpitations and or SOB. If these symptoms were to arise,  we can screen her again. We also discussed that her breast tissue vibration discomfort is possibly related to nerve related pain. Otherwise no further w/u is warranted at this time   PLAN:    In order of problems listed above:  No further cardiac work up is recommended at this time        Medication Adjustments/Labs and Tests Ordered: Current medicines are reviewed at length with the patient today.  Concerns regarding medicines are outlined above.  No orders of the defined types were placed in this encounter.  No orders of the defined types were placed in this encounter.   Patient Instructions  Medication Instructions:  No Changes In Medications at this time.  *If you need a refill on your cardiac medications before your next appointment, please call your pharmacy*  Lab Work: None Ordered At This Time.  If you have labs (blood work) drawn today and your tests are completely normal, you will receive your results only by: [place] (if you have MyChart) OR A paper copy in the mail If you have any lab test that is abnormal or we need to change your treatment, we will call you to review the results.  Testing/Procedures: None Ordered At This Time.   Follow-Up: At [facility], you and your health needs are our priority.  As part of our continuing mission to provide you with exceptional heart care, we have created designated Provider Care Teams.   These Care Teams include your primary Cardiologist (physician) and Advanced Practice Providers (APPs -  Physician Assistants and Nurse Practitioners) who all work together to provide you with the care you need, when you need it.  Your next appointment:   AS NEEDED   The format for your next appointment:   In Person  Provider:   Janina Mayo, MD         Signed, Janina Mayo, MD  11/23/2021 10:16 AM    [place]

## 2021-11-23 NOTE — Patient Instructions (Signed)

## 2022-03-05 ENCOUNTER — Other Ambulatory Visit: Payer: Self-pay | Admitting: General Surgery

## 2022-03-05 DIAGNOSIS — Z853 Personal history of malignant neoplasm of breast: Secondary | ICD-10-CM

## 2022-05-21 ENCOUNTER — Ambulatory Visit
Admission: RE | Admit: 2022-05-21 | Discharge: 2022-05-21 | Disposition: A | Discharge status: Home / Self Care | Payer: Medicare Other | Source: Ambulatory Visit | Attending: General Surgery | Admitting: General Surgery

## 2022-05-21 DIAGNOSIS — Z853 Personal history of malignant neoplasm of breast: Secondary | ICD-10-CM

## 2023-03-12 ENCOUNTER — Encounter (INDEPENDENT_AMBULATORY_CARE_PROVIDER_SITE_OTHER): Payer: Self-pay | Admitting: *Deleted

## 2023-04-01 ENCOUNTER — Encounter (INDEPENDENT_AMBULATORY_CARE_PROVIDER_SITE_OTHER): Payer: Self-pay | Admitting: Gastroenterology

## 2023-04-01 ENCOUNTER — Ambulatory Visit (INDEPENDENT_AMBULATORY_CARE_PROVIDER_SITE_OTHER): Payer: Medicare Other | Admitting: Gastroenterology

## 2023-04-01 VITALS — BP 151/77 | HR 65 | Temp 97.9°F | Ht 64.0 in | Wt 173.6 lb

## 2023-04-01 DIAGNOSIS — K6289 Other specified diseases of anus and rectum: Secondary | ICD-10-CM | POA: Diagnosis not present

## 2023-04-01 NOTE — Patient Instructions (Addendum)
Start using "Donut cushion" to decrease discomfort when sitting Check fecal occult cards x 3 If abnormal carts, we will need to proceed with a colonoscopy. If presenting persistent symptoms despite using cushion, will need to proceed with flexible sigmoidoscopy

## 2023-04-01 NOTE — Progress Notes (Signed)
Katrinka Blazing, M.D. Gastroenterology & Hepatology [facility]astro[facility] [address] [place], [place] [address] Primary Care Physician: Pomposini, Rande Brunt, MD No address on file  Referring MD: PCP  Chief Complaint: Rectal discomfort  History of Present Illness: KIWANNA BOUSKA is a 77 y.o. female with past medical history of breast cancer, diverticulosis, GERD, osteopenia, who presents for evaluation of rectal pressure.  Patient reports that 3 weeks ago, she presented new onset of total body ache. Due to this, she followed with her PCP who tested her and found she had a UTI and the flu. She was given a 7 day ciprofloxacin course and states her symptoms have much more improved, although she still feels some malaise. States she still notices her urine has a strong smell. States she just had a urine culture checked on Wednesday, but the result is not back yet.  She reports having some discomfort in her rectum for more than 2 years. This happens when she sits down. She states this is intermittent. Does not have pain but feels discomfort.  She does not strain to move her bowels.  Does not have diarrhea or constipation.  States that she would like to have her stools checked for fecal occult blood.  The patient denies having any nausea, vomiting, fever, chills, hematochezia, melena, hematemesis, abdominal distention, abdominal pain, diarrhea, jaundice, pruritus or weight loss.  Patient used to see Dr. Myrtie Neither at [facility].  Last time seen in this office was in 2021, was being seen for episode of diverticulitis.  Last KGM:WNUUV Last Colonoscopy:9 years ago, states it was normal. No results are available.  FHx: neg for any gastrointestinal/liver disease, aunts had breast cancer Social: neg smoking, alcohol or illicit drug use Surgical: partial nephrectomy, appendectomy  Past Medical History: Past Medical History:  Diagnosis Date   Breast cancer ([facility]) 2019    left breast   Chronic back pain    Chronic kidney disease (CKD)    Diverticular disease    Diverticulosis    Family history of breast cancer    Family history of melanoma    Family history of squamous cell carcinoma of skin    GERD (gastroesophageal reflux disease)    Glaucoma    Glaucoma (increased eye pressure)    Hyperglycemia    Hyperglycemia    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   Oncocytoma    of the kidney   Osteopenia    Personal history of radiation therapy    PONV (postoperative nausea and vomiting)    Seasonal allergies    Seasonal allergies     Past Surgical History: Past Surgical History:  Procedure Laterality Date   APPENDECTOMY     BREAST BIOPSY  08/14/2017   pos   BREAST BIOPSY Left 03/2020   neg   BREAST CYST EXCISION Bilateral    benign   BREAST LUMPECTOMY Left 2019/4   BREAST LUMPECTOMY WITH RADIOACTIVE SEED LOCALIZATION Left 08/14/2017   Procedure: BREAST LUMPECTOMY WITH RADIOACTIVE SEED LOCALIZATION;  Surgeon: Emelia Loron, MD;  Location: [facility];  Service: General;  Laterality: Left;   CATARACT EXTRACTION Bilateral    lens inplants   KIDNEY CYST REMOVAL     RE-EXCISION OF BREAST LUMPECTOMY Left 09/03/2017   Procedure: RE-EXCISION LEFT BREAST MARGIN;  Surgeon: Emelia Loron, MD;  Location: [facility];  Service: General;  Laterality: Left;   VAGINAL HYSTERECTOMY     partial    Family  History: Family History  Problem Relation Age of Onset   Autoimmune disease Mother    Diabetes Father    Skin cancer Father 56       squamous cell   Breast cancer Paternal Aunt 33   Melanoma Sister 59   Cancer Maternal Grandmother 51       type unk   Cancer Paternal Grandmother 97       mouth   Breast cancer Paternal Aunt 94   Breast cancer Other 34   Colon cancer Neg Hx    Rectal cancer Neg Hx    Throat cancer Neg Hx     Social History: Social History   Tobacco Use   Smoking Status Never  Smokeless Tobacco Never   Social History   Substance and Sexual Activity  Alcohol Use No   Social History   Substance and Sexual Activity  Drug Use No    Allergies: Allergies  Allergen Reactions   Atorvastatin     Other reaction(s): Myalgias (Muscle Pain)   Cefdinir    Clarithromycin    Macrobid [Nitrofurantoin Monohyd Macro] Nausea And Vomiting   Pollen Extract    Sulfa Antibiotics    Sulfamethoxazole-Trimethoprim Nausea And Vomiting    Other reaction(s): Other (See Comments), Unknown unknown unknown    Tylenol [Acetaminophen]     Facial flushing from eyes down neck    Medications: Current Outpatient Medications  Medication Sig Dispense Refill   Calcium Carbonate-Vitamin D 600-400 MG-UNIT tablet Take 1 tablet by mouth 2 (two) times daily.     Cetirizine HCl 10 MG CAPS Zyrtec 10 mg capsule  Take 1 capsule every day by oral route.     cholecalciferol (VITAMIN D) 1000 UNITS tablet Take 1,000 Units by mouth daily.     Cyanocobalamin (VITAMIN B 12 PO) Take 1 tablet by mouth every morning.     famotidine (PEPCID) 20 MG tablet Take 20 mg by mouth at bedtime.     folic acid (FOLVITE) 400 MCG tablet Take 400 mcg by mouth daily.     ibuprofen (ADVIL,MOTRIN) 200 MG tablet Take 400 mg by mouth daily as needed. For pain     pantoprazole (PROTONIX) 40 MG tablet Take 40 mg by mouth daily.     vitamin E 400 UNIT capsule Take 400 Units by mouth daily.     No current facility-administered medications for this visit.    Review of Systems: GENERAL: negative for malaise, night sweats HEENT: No changes in hearing or vision, no nose bleeds or other nasal problems. NECK: Negative for lumps, goiter, pain and significant neck swelling RESPIRATORY: Negative for cough, wheezing CARDIOVASCULAR: Negative for chest pain, leg swelling, palpitations, orthopnea GI: SEE HPI MUSCULOSKELETAL: Negative for joint pain or swelling, back pain, and muscle pain. SKIN: Negative  for lesions, rash PSYCH: Negative for sleep disturbance, mood disorder and recent psychosocial stressors. HEMATOLOGY Negative for prolonged bleeding, bruising easily, and swollen nodes. ENDOCRINE: Negative for cold or heat intolerance, polyuria, polydipsia and goiter. NEURO: negative for tremor, gait imbalance, syncope and seizures. The remainder of the review of systems is noncontributory.   Physical Exam: BP (!) 151/77 (BP Location: Left Arm, Patient Position: Sitting, Cuff Size: Large)   Pulse 65   Temp 97.9 F (36.6 C) (Oral)   Ht 5\' 4"  (1.626 m)   Wt 173 lb 9.6 oz (78.7 kg)   BMI 29.80 kg/m  GENERAL: The patient is AO x3, in no acute distress. HEENT: Head is normocephalic and atraumatic. EOMI are intact.  Mouth is well hydrated and without lesions. NECK: Supple. No masses LUNGS: Clear to auscultation. No presence of rhonchi/wheezing/rales. Adequate chest expansion HEART: RRR, normal s1 and s2. ABDOMEN: Soft, nontender, no guarding, no peritoneal signs, and nondistended. BS +. No masses. RECTAL EXAM: no external lesions, normal tone, slightly boggy mucosa on DRE but no masses where palpable, brown stool without blood. Chaperone: Joanette Gula, CMA EXTREMITIES: Without any cyanosis, clubbing, rash, lesions or edema. NEUROLOGIC: AOx3, no focal motor deficit. SKIN: no jaundice, no rashes   Imaging/Labs: as above  I personally reviewed and interpreted the available labs, imaging and endoscopic files.  Impression and Plan: RAJNI STREETMAN is a 77 y.o. female with past medical history of breast cancer, diverticulosis, GERD, osteopenia, who presents for evaluation of rectal pressure.  Patient had presence of discomfort in the rectum chronically without presence of red flag signs.  External inspection of the anus did not show any abnormalities, there was presence of boggy mucosa which raised the concern for possible hemorrhoids.  This could be symptomatic at the moment.  She may benefit  from using a cushion to sit.  However, if she were to have persistent symptoms, we will need to evaluate this endoscopically.  She will need to have fecal occult blood cards checked x 3.  If abnormal, she will need to have a colonoscopy, but if not then we will consider a flexible sigmoidoscopy.  -Start using "Donut cushion" to decrease discomfort when sitting -Check fecal occult cards x 3 -If abnormal cards, we will need to proceed with a colonoscopy. -If presenting persistent symptoms despite using cushion, will need to proceed with flexible sigmoidoscopy  All questions were answered.      Katrinka Blazing, MD Gastroenterology and Hepatology [facility] Gastroenterology

## 2023-04-10 ENCOUNTER — Other Ambulatory Visit (INDEPENDENT_AMBULATORY_CARE_PROVIDER_SITE_OTHER): Payer: Self-pay | Admitting: *Deleted

## 2023-04-10 DIAGNOSIS — K6289 Other specified diseases of anus and rectum: Secondary | ICD-10-CM

## 2023-04-10 LAB — POC HEMOCCULT BLD/STL (HOME/3-CARD/SCREEN)
Card #2 Fecal Occult Blod, POC: NEGATIVE
Card #3 Fecal Occult Blood, POC: NEGATIVE
Fecal Occult Blood, POC: NEGATIVE

## 2023-04-16 NOTE — Progress Notes (Signed)
Hi please inform that the 3 stool card patient submitted did not suggest any occult blood in stool.  If patient continues to have rectal discomfort recommend continuing following the clinic

## 2023-04-25 ENCOUNTER — Other Ambulatory Visit: Payer: Self-pay | Admitting: General Surgery

## 2023-04-25 ENCOUNTER — Encounter: Payer: Self-pay | Admitting: General Surgery

## 2023-04-25 DIAGNOSIS — Z9889 Other specified postprocedural states: Secondary | ICD-10-CM

## 2023-05-28 ENCOUNTER — Other Ambulatory Visit: Payer: Self-pay | Admitting: General Surgery

## 2023-05-28 ENCOUNTER — Ambulatory Visit
Admission: RE | Admit: 2023-05-28 | Discharge: 2023-05-28 | Disposition: A | Discharge status: Home / Self Care | Payer: Medicare Other | Source: Ambulatory Visit | Attending: General Surgery | Admitting: General Surgery

## 2023-05-28 DIAGNOSIS — Z9889 Other specified postprocedural states: Secondary | ICD-10-CM

## 2023-05-28 DIAGNOSIS — N6320 Unspecified lump in the left breast, unspecified quadrant: Secondary | ICD-10-CM

## 2023-05-31 ENCOUNTER — Ambulatory Visit
Admission: RE | Admit: 2023-05-31 | Discharge: 2023-05-31 | Disposition: A | Discharge status: Home / Self Care | Payer: Medicare Other | Source: Ambulatory Visit | Attending: General Surgery | Admitting: General Surgery

## 2023-05-31 DIAGNOSIS — N6320 Unspecified lump in the left breast, unspecified quadrant: Secondary | ICD-10-CM

## 2023-05-31 HISTORY — PX: BREAST BIOPSY: SHX20

## 2023-06-04 LAB — SURGICAL PATHOLOGY

## 2023-09-11 NOTE — Progress Notes (Addendum)
 Referring Provider: Susana Enter, FNP Primary Care Physician:  Susana Enter, FNP Primary GI Physician: Dr. Sammi Crick  Chief Complaint  Patient presents with   Diarrhea    Diarrhea and stomach pains.     HPI:   Stacey Church is a 78 y.o. female with history of breast cancer, diverticulosis, osteopenia, GERD, presenting today with chief complaint of frequent bowel movements and lower abdominal pain.   Last seen in the office by Dr. Sammi Crick 04/01/2023 reporting 2 years of rectal discomfort when sitting down.  Symptoms are intermittent.  Rectal exam performed without any significant abnormalities though there was some boggy mucosa on DRE raising the concern for possible hemorrhoids.  Recommended using donut cushion, fecal occult cards x 3.  If abnormal cards, proceed with colonoscopy.  If persistent rectal discomfort despite using cushion, proceed with flexible sigmoidoscopy.  Fecal occult cards were negative.   Today:  12 am Sunday morning, woke up with frequent bowel movements. Went 8 times. 9 am, felt better. Monday night, started to get symptoms again. Would get abdominal pain then have to have a BM, but would only pass a tablespoon, mucous, clear. This continues to Tuesday morning. Since Tuesday, no abdominal pain, but is tender across the lower abdomen. No diarrhea since Tuesday.   Ate salad on Saturday night. Wonders if it was related to this as she does not usually eat salad.  Had UTI with e coli in March and was treated with cipro.  Developed severe constipation for couple weeks thereafter and went to the ER on 08/07/23 as she thought she had an obstruction, but had no abdominal pain, just did not have a bowel movement.  Had a CT and said there was very mild abnormality and was prescribed 10 days of Augmentin.  Per ER discharge paper, diagnosis was colitis.  Patient states symptoms resolved after that her bowels returned to normal until this past weekend.  At baseline, she  always has 1-2 Bms daily.   No BRBPR or melena. No nausea or vomiting.    Last EGD: never Last Colonoscopy: in 2015, states it was normal. No results are available.   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   Oncocytoma    of the kidney   Osteopenia    Personal history of radiation therapy    PONV (postoperative nausea and vomiting)    Seasonal allergies    Seasonal allergies     Past Surgical History:  Procedure Laterality Date   APPENDECTOMY     BREAST BIOPSY  08/14/2017   pos   BREAST BIOPSY Left 03/2020   neg   BREAST BIOPSY Left 05/31/2023   US  LT BREAST BX W LOC DEV 1ST LESION IMG BX SPEC US  GUIDE 05/31/2023 [facility]   BREAST CYST EXCISION Bilateral  benign   BREAST LUMPECTOMY Left 2019/4   BREAST LUMPECTOMY WITH RADIOACTIVE SEED LOCALIZATION Left 08/14/2017   Procedure: BREAST LUMPECTOMY WITH RADIOACTIVE SEED LOCALIZATION;  Surgeon: Enid Harry, MD;  Location: [facility];  Service: General;  Laterality: Left;   CATARACT EXTRACTION Bilateral    lens inplants   KIDNEY CYST REMOVAL     RE-EXCISION OF BREAST LUMPECTOMY Left 09/03/2017   Procedure: RE-EXCISION LEFT BREAST MARGIN;  Surgeon: Enid Harry, MD;  Location: [facility];  Service: General;  Laterality: Left;   VAGINAL HYSTERECTOMY     partial    Current Outpatient Medications   Medication Sig Dispense Refill   Calcium Carbonate-Vitamin D 600-400 MG-UNIT tablet Take 1 tablet by mouth 2 (two) times daily.     Cetirizine HCl 10 MG CAPS Zyrtec 10 mg capsule  Take 1 capsule every day by oral route.     cholecalciferol (VITAMIN D) 1000 UNITS tablet Take 1,000 Units by mouth daily.     Cyanocobalamin (VITAMIN B 12 PO) Take 1 tablet by mouth every morning.     famotidine (PEPCID) 20 MG tablet Take 20 mg by mouth at bedtime.     folic acid (FOLVITE) 400 MCG tablet Take 400 mcg by mouth daily.     ibuprofen (ADVIL,MOTRIN) 200 MG tablet Take 400 mg by mouth daily as needed. For pain     ondansetron  (ZOFRAN -ODT) 8 MG disintegrating tablet SMARTSIG:1 Tablet(s) Sublingual PRN     pantoprazole (PROTONIX) 40 MG tablet Take 40 mg by mouth daily.     vitamin E 400 UNIT capsule Take 400 Units by mouth daily.     No current facility-administered medications for this visit.    Allergies as of 09/12/2023 - Review Complete 09/12/2023  Allergen Reaction Noted   Atorvastatin  08/24/2021   Cefdinir  08/24/2021   Clarithromycin  08/24/2021   Macrobid [nitrofurantoin monohyd macro] Nausea And Vomiting 09/29/2013   Pollen extract  08/24/2021   Sulfa antibiotics  08/24/2021   Sulfamethoxazole-trimethoprim Nausea And Vomiting 12/02/2011   Tylenol  [acetaminophen ]  08/28/2017    Family History  Problem Relation Age of Onset   Autoimmune disease Mother    Diabetes Father    Skin cancer Father 67       squamous cell   Breast cancer Paternal Aunt 37   Melanoma Sister 19   Cancer Maternal Grandmother 56       type unk   Cancer Paternal Grandmother 38       mouth   Breast cancer Paternal Aunt 11   Breast cancer Other 34   Colon cancer Neg Hx    Rectal cancer Neg Hx    Throat cancer Neg Hx     Social History   Socioeconomic History   Marital status: Married    Spouse name: Not on file   Number of children: 2   Years of education: Not on file   Highest education level: Not  on file  Occupational History   Occupation: retired  Tobacco Use   Smoking status: Never   Smokeless tobacco: Never  Vaping Use   Vaping status: Never Used  Substance and Sexual Activity   Alcohol use: No   Drug use: No   Sexual activity: Not on file  Other Topics Concern   Not on file  Social History Narrative   Not on file   Social Drivers of Health   Financial Resource Strain: Not on file  Food Insecurity: Not on file  Transportation  Needs: Not on file  Physical Activity: Not on file  Stress: Not on file  Social Connections: Not on file    Review of Systems: Gen: Denies fever, chills, cold or flulike symptoms, presyncope, syncope. CV: Denies chest pain, palpitations.  Resp: Denies dyspnea, cough.  GI: See HPI Heme: See HPI  Physical Exam: BP 135/85 (BP Location: Right Arm, Patient Position: Sitting, Cuff Size: Large)   Pulse 61   Temp 97.8 F (36.6 C) (Temporal)   Ht 5\' 4"  (1.626 m)   Wt 171 lb 12.8 oz (77.9 kg)   BMI 29.49 kg/m  General:   Alert and oriented. No distress noted. Pleasant and cooperative.  Head:  Normocephalic and atraumatic. Eyes:  Conjuctiva clear without scleral icterus. Heart:  S1, S2 present without murmurs appreciated. Lungs:  Clear to auscultation bilaterally. No wheezes, rales, or rhonchi. No distress.  Abdomen:  +BS, soft, non-tender and non-distended. No rebound or guarding. No HSM or masses noted. Msk:  Symmetrical without gross deformities. Normal posture. Extremities:  Without edema. Neurologic:  Alert and  oriented x4 Psych:  Normal mood and affect.    Assessment:  78 y.o. female with history of breast cancer, diverticulosis, osteopenia, GERD, presenting today with chief complaint of frequent bowel movements and lower abdominal pain.   Change in bowel habits/lower abdominal pain:  Acute onset frequent but solid bowel movements starting around 12 AM on 5/11 and resolved in 9 hours, but late evening 5/12 developed lower  abdominal pain with urgency to have a bowel movement passing only small amount of stool, mucus, or clear discharge.  All symptoms resolved Tuesday aside from a very mild tenderness in her lower abdomen. No brbpr, melena, nausea, or vomiting. Notably, she has recently been on antibiotics for UTI.  Also antibiotics in early April after an episode of severe constipation leading to ER evaluation where she had a CT scan that possibly showed colitis (no report available) and was prescribed Augmentin for 10 days.  Considering this, she is at risk for C. difficile, but as symptoms have resolved, I do not think we are dealing with C. difficile or persistent bacterial infection.  Is very possible that she had acute viral gastroenteritis contributing to her recent change in bowel habits.  However, we did discuss the fact that it has been 10 years since her last colonoscopy and also with recent episode of colitis and change in bowel habits, would consider updating a colonoscopy.  Patient is open to having a colonoscopy as long as she has a small volume prep.  Will hold off on scheduling until I get the CT report from [facility] to review.  In the meantime, patient was advised to notify me of any recurrent issues for abdominal pain.  Plan:  Request CT report from [facility] Consider colonoscopy pending review of CT report. Okay to advance to regular diet. Patient will monitor for recurrent bowel issues or abdominal pain and let me know if this occurs.   Shana Daring, PA-C [facility] 09/12/2023   I have reviewed the note and agree with the APP's assessment as described in this progress note  Samantha Cress, MD Gastroenterology and Hepatology [facility]

## 2023-09-12 ENCOUNTER — Encounter: Payer: Self-pay | Admitting: Gastroenterology

## 2023-09-12 ENCOUNTER — Ambulatory Visit (INDEPENDENT_AMBULATORY_CARE_PROVIDER_SITE_OTHER): Admitting: Gastroenterology

## 2023-09-12 VITALS — BP 135/85 | HR 61 | Temp 97.8°F | Ht 64.0 in | Wt 171.8 lb

## 2023-09-12 DIAGNOSIS — R194 Change in bowel habit: Secondary | ICD-10-CM

## 2023-09-12 DIAGNOSIS — R933 Abnormal findings on diagnostic imaging of other parts of digestive tract: Secondary | ICD-10-CM | POA: Diagnosis not present

## 2023-09-12 DIAGNOSIS — R1030 Lower abdominal pain, unspecified: Secondary | ICD-10-CM | POA: Diagnosis not present

## 2023-09-12 NOTE — Patient Instructions (Addendum)
 I am requesting your CT scan from [place] and will be in contact with you regarding further recommendations.   You can go ahead and advance her diet back to normal.  No specific dietary restrictions.  We know if you have any recurrent issues with lower abdominal pain or frequent bowel movements.  Shana Daring, PA-C [facility]

## 2023-10-20 ENCOUNTER — Telehealth: Payer: Self-pay | Admitting: Gastroenterology

## 2023-10-20 NOTE — Telephone Encounter (Signed)
 Received and reviewed CT report dated 08/06/2023 which showed fluid in the ascending to transverse colon, moderate stool in the descending and rectosigmoid colon.  Mural thickening and adjacent stranding involving the sigmoid colon suggestive of colitis.  We can proceed with arranging colonoscopy with Dr. Eartha. Dx: History of colitis Patient requested small-volume prep. ASA 2

## 2023-10-21 NOTE — Telephone Encounter (Signed)
 LMOVM to return call.

## 2023-10-23 NOTE — Telephone Encounter (Signed)
 Called to get pt scheduled. She stated she just had her yearly check-up with PCP and she done a blood test to see if there was blood in her colon. She said once she gets with her PCP, she will call back.

## 2023-12-03 ENCOUNTER — Telehealth (INDEPENDENT_AMBULATORY_CARE_PROVIDER_SITE_OTHER): Payer: Self-pay | Admitting: Gastroenterology

## 2023-12-03 ENCOUNTER — Encounter: Payer: Self-pay | Admitting: *Deleted

## 2023-12-03 ENCOUNTER — Other Ambulatory Visit: Payer: Self-pay | Admitting: *Deleted

## 2023-12-03 MED ORDER — NA SULFATE-K SULFATE-MG SULF 17.5-3.13-1.6 GM/177ML PO SOLN: ORAL | 0 refills | Status: AC

## 2023-12-03 NOTE — Telephone Encounter (Signed)
 Pt has been scheduled for 12/24/23. Instructions mailed and prep sent to pharmacy.

## 2023-12-03 NOTE — Telephone Encounter (Signed)
 Patient called to schedule her colonoscopy. [phone]

## 2023-12-20 ENCOUNTER — Encounter (HOSPITAL_COMMUNITY)
Admission: RE | Admit: 2023-12-20 | Discharge: 2023-12-20 | Disposition: A | Discharge status: Home / Self Care | Source: Ambulatory Visit | Attending: Gastroenterology | Admitting: Gastroenterology

## 2023-12-23 ENCOUNTER — Encounter (HOSPITAL_COMMUNITY): Payer: Self-pay

## 2023-12-23 ENCOUNTER — Other Ambulatory Visit: Payer: Self-pay

## 2023-12-23 NOTE — Telephone Encounter (Signed)
 LMOVM to return call   Pt left vm about upcoming procedure

## 2023-12-24 ENCOUNTER — Encounter (INDEPENDENT_AMBULATORY_CARE_PROVIDER_SITE_OTHER): Payer: Self-pay | Admitting: *Deleted

## 2023-12-24 ENCOUNTER — Other Ambulatory Visit: Payer: Self-pay

## 2023-12-24 ENCOUNTER — Ambulatory Visit (HOSPITAL_COMMUNITY)
Admission: RE | Admit: 2023-12-24 | Discharge: 2023-12-24 | Disposition: A | Discharge status: Home / Self Care | Attending: Gastroenterology | Admitting: Gastroenterology

## 2023-12-24 ENCOUNTER — Encounter (HOSPITAL_COMMUNITY)
Admission: RE | Disposition: A | Discharge status: Home / Self Care | Payer: Self-pay | Source: Home / Self Care | Attending: Gastroenterology

## 2023-12-24 ENCOUNTER — Encounter (HOSPITAL_COMMUNITY): Payer: Self-pay | Admitting: Gastroenterology

## 2023-12-24 ENCOUNTER — Ambulatory Visit (HOSPITAL_BASED_OUTPATIENT_CLINIC_OR_DEPARTMENT_OTHER): Admitting: Certified Registered"

## 2023-12-24 ENCOUNTER — Ambulatory Visit (HOSPITAL_COMMUNITY): Admitting: Certified Registered"

## 2023-12-24 DIAGNOSIS — M8580 Other specified disorders of bone density and structure, unspecified site: Secondary | ICD-10-CM | POA: Diagnosis not present

## 2023-12-24 DIAGNOSIS — K219 Gastro-esophageal reflux disease without esophagitis: Secondary | ICD-10-CM | POA: Insufficient documentation

## 2023-12-24 DIAGNOSIS — Z8719 Personal history of other diseases of the digestive system: Secondary | ICD-10-CM

## 2023-12-24 DIAGNOSIS — Z853 Personal history of malignant neoplasm of breast: Secondary | ICD-10-CM | POA: Diagnosis not present

## 2023-12-24 DIAGNOSIS — D120 Benign neoplasm of cecum: Secondary | ICD-10-CM

## 2023-12-24 DIAGNOSIS — R1030 Lower abdominal pain, unspecified: Secondary | ICD-10-CM | POA: Diagnosis present

## 2023-12-24 DIAGNOSIS — I10 Essential (primary) hypertension: Secondary | ICD-10-CM | POA: Diagnosis not present

## 2023-12-24 DIAGNOSIS — K529 Noninfective gastroenteritis and colitis, unspecified: Secondary | ICD-10-CM | POA: Diagnosis not present

## 2023-12-24 DIAGNOSIS — D122 Benign neoplasm of ascending colon: Secondary | ICD-10-CM | POA: Insufficient documentation

## 2023-12-24 DIAGNOSIS — K5730 Diverticulosis of large intestine without perforation or abscess without bleeding: Secondary | ICD-10-CM

## 2023-12-24 DIAGNOSIS — D123 Benign neoplasm of transverse colon: Secondary | ICD-10-CM | POA: Diagnosis not present

## 2023-12-24 HISTORY — PX: COLONOSCOPY: SHX5424

## 2023-12-24 LAB — HM COLONOSCOPY

## 2023-12-24 SURGERY — COLONOSCOPY
Anesthesia: General

## 2023-12-24 MED ORDER — LIDOCAINE 2% (20 MG/ML) 5 ML SYRINGE
INTRAMUSCULAR | Status: DC | PRN
  Administered 2023-12-24: 80 mg via INTRAVENOUS

## 2023-12-24 MED ORDER — LACTATED RINGERS IV SOLN: INTRAVENOUS | Status: DC | PRN

## 2023-12-24 MED ORDER — PROPOFOL 500 MG/50ML IV EMUL
INTRAVENOUS | Status: DC | PRN
  Administered 2023-12-24: 125 ug/kg/min via INTRAVENOUS

## 2023-12-24 MED ORDER — GLYCOPYRROLATE PF 0.2 MG/ML IJ SOSY
PREFILLED_SYRINGE | INTRAMUSCULAR | Status: DC | PRN
  Administered 2023-12-24: .2 mg via INTRAVENOUS

## 2023-12-24 MED ORDER — EPHEDRINE SULFATE (PRESSORS) 50 MG/ML IJ SOLN
INTRAMUSCULAR | Status: DC | PRN
  Administered 2023-12-24: 10 mg via INTRAVENOUS

## 2023-12-24 MED ORDER — PROPOFOL 10 MG/ML IV BOLUS
INTRAVENOUS | Status: DC | PRN
  Administered 2023-12-24: 50 mg via INTRAVENOUS

## 2023-12-24 NOTE — Transfer of Care (Signed)
 Immediate Anesthesia Transfer of Care Note  Patient: Stacey Church  Procedure(s) Performed: COLONOSCOPY  Patient Location: PACU  Anesthesia Type:MAC  Level of Consciousness: awake and alert   Airway & Oxygen Therapy: Patient Spontanous Breathing and Patient connected to nasal cannula oxygen  Post-op Assessment: Report given to RN and Post -op Vital signs reviewed and stable  Post vital signs: Reviewed and stable  Last Vitals:  Vitals Value Taken Time  BP    Temp    Pulse    Resp    SpO2      Last Pain:  Vitals:   12/24/23 0957  TempSrc: Oral  PainSc: 0-No pain      Patients Stated Pain Goal: 7 (12/24/23 0900)  Complications: No notable events documented.

## 2023-12-24 NOTE — H&P (Signed)
 Stacey Church is an 78 y.o. female.   Chief Complaint:  lower abdominal pain and evaluation of history of colitis.  HPI: Stacey Church is a 78 y.o. female with history of breast cancer, diverticulosis, osteopenia, GERD, presenting today with lower abdominal pain and evaluation of history of colitis.   States she had some issues with lower abdominal pain intermittently. Diarrhea has resolved. The patient denies having any nausea, vomiting, fever, chills, hematochezia, melena, hematemesis, abdominal distention, diarrhea, jaundice, pruritus or weight loss.   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   Oncocytoma    of the kidney   Osteopenia    Personal history of radiation therapy    PONV (postoperative nausea and vomiting)    Seasonal allergies    Seasonal allergies     Past Surgical History:  Procedure Laterality Date   APPENDECTOMY     BREAST BIOPSY  08/14/2017   pos   BREAST BIOPSY Left 03/2020   neg   BREAST BIOPSY Left 05/31/2023   US  LT BREAST BX W LOC DEV 1ST LESION IMG BX SPEC US  GUIDE 05/31/2023 [facility]   BREAST CYST EXCISION Bilateral    benign   BREAST LUMPECTOMY Left 2019/4   BREAST LUMPECTOMY WITH RADIOACTIVE SEED LOCALIZATION Left 08/14/2017   Procedure: BREAST LUMPECTOMY WITH RADIOACTIVE SEED LOCALIZATION;  Surgeon: Ebbie Cough, MD;  Location: [facility];  Service: General;  Laterality: Left;   CATARACT EXTRACTION Bilateral    lens inplants   KIDNEY CYST REMOVAL     RE-EXCISION OF BREAST  LUMPECTOMY Left 09/03/2017   Procedure: RE-EXCISION LEFT BREAST MARGIN;  Surgeon: Ebbie Cough, MD;  Location: [facility];  Service: General;  Laterality: Left;   VAGINAL HYSTERECTOMY     partial    Family History  Problem Relation Age of Onset   Autoimmune disease Mother    Diabetes Father    Skin cancer Father 76       squamous cell   Breast cancer Paternal Aunt 42   Melanoma Sister 10   Cancer Maternal Grandmother 31       type unk   Cancer Paternal Grandmother 51       mouth   Breast cancer Paternal Aunt 40   Breast cancer Other 34   Colon cancer Neg Hx    Rectal cancer Neg Hx    Throat cancer Neg Hx    Social History:  reports that she has never smoked. She has never used smokeless tobacco. She reports that she does not drink alcohol and does not use drugs.  Allergies:  Allergies  Allergen Reactions   Atorvastatin     Other reaction(s): Myalgias (Muscle Pain)   Cefdinir    Clarithromycin    Macrobid [Nitrofurantoin Monohyd Macro] Nausea And Vomiting   Pollen Extract    Sulfa Antibiotics    Sulfamethoxazole-Trimethoprim Nausea And Vomiting    Other reaction(s): Other (See Comments), Unknown  unknown unknown    Tylenol  [Acetaminophen ]     Facial flushing from eyes down neck    Medications Prior to Admission  Medication Sig Dispense Refill   Calcium Carbonate-Vitamin D 600-400 MG-UNIT tablet Take 1 tablet by mouth 2 (two) times daily.     Cetirizine HCl 10 MG CAPS Zyrtec 10 mg capsule  Take 1 capsule every day by oral route.     cholecalciferol (VITAMIN D) 1000 UNITS tablet Take 1,000 Units by mouth daily.     Cyanocobalamin (VITAMIN B 12 PO) Take 1 tablet by mouth every morning.     folic acid (FOLVITE) 400 MCG tablet Take 400 mcg by mouth daily.     ibuprofen (ADVIL,MOTRIN) 200 MG tablet Take 400 mg by mouth daily as needed. For pain     Na Sulfate-K Sulfate-Mg Sulfate concentrate (SUPREP) 17.5-3.13-1.6 GM/177ML SOLN As directed 354 mL 0    pantoprazole (PROTONIX) 40 MG tablet Take 40 mg by mouth daily.     vitamin E 400 UNIT capsule Take 400 Units by mouth daily.     famotidine (PEPCID) 20 MG tablet Take 20 mg by mouth at bedtime.     ondansetron  (ZOFRAN -ODT) 8 MG disintegrating tablet SMARTSIG:1 Tablet(s) Sublingual PRN      No results found for this or any previous visit (from the past 48 hours). No results found.  Review of Systems  Gastrointestinal:  Positive for abdominal pain.  All other systems reviewed and are negative.   Blood pressure (!) 154/67, pulse (!) 51, temperature (!) 97.5 F (36.4 C), resp. rate 13, height 5' 4 (1.626 m), weight 73 kg, SpO2 100%. Physical Exam  GENERAL: The patient is AO x3, in no acute distress. HEENT: Head is normocephalic and atraumatic. EOMI are intact. Mouth is well hydrated and without lesions. NECK: Supple. No masses LUNGS: Clear to auscultation. No presence of rhonchi/wheezing/rales. Adequate chest expansion HEART: RRR, normal s1 and s2. ABDOMEN: Soft, nontender, no guarding, no peritoneal signs, and nondistended. BS +. No masses. EXTREMITIES: Without any cyanosis, clubbing, rash, lesions or edema. NEUROLOGIC: AOx3, no focal motor deficit. SKIN: no jaundice, no rashes  Assessment/Plan Stacey Church is a 78 y.o. female with history of breast cancer, diverticulosis, osteopenia, GERD, presenting today with lower abdominal pain and evaluation of history of colitis.  Will proceed with colonoscopy.  Toribio Eartha Flavors, MD 12/24/2023, 8:16 AM

## 2023-12-24 NOTE — Discharge Instructions (Signed)
 You are being discharged to home.  Resume your previous diet.  We are waiting for your pathology results.  Your physician has recommended a repeat colonoscopy for surveillance based on pathology results.

## 2023-12-24 NOTE — Anesthesia Postprocedure Evaluation (Signed)
 Anesthesia Post Note  Patient: Stacey Church  Procedure(s) Performed: COLONOSCOPY  Patient location during evaluation: Phase II Anesthesia Type: General Level of consciousness: awake Pain management: pain level controlled Vital Signs Assessment: post-procedure vital signs reviewed and stable Respiratory status: spontaneous breathing and respiratory function stable Cardiovascular status: blood pressure returned to baseline and stable Postop Assessment: no headache and no apparent nausea or vomiting Anesthetic complications: no Comments: Late entry   No notable events documented.   Last Vitals:  Vitals:   12/24/23 0804 12/24/23 0957  BP: (!) 154/67 (!) 109/50  Pulse: (!) 51 86  Resp: 13 17  Temp: (!) 36.4 C 36.6 C  SpO2: 100% 99%    Last Pain:  Vitals:   12/24/23 0957  TempSrc: Oral  PainSc: 0-No pain                 Yvonna JINNY Bosworth

## 2023-12-24 NOTE — Op Note (Addendum)
 [facility] Patient Name: Stacey Church Procedure Date: 12/24/2023 8:46 AM MRN: [id number] Date of Birth: [birth year] Attending MD: Toribio Fortune , , [id number] CSN: [id number] Age: 78 Admit Type: Outpatient Procedure:                Colonoscopy Indications:              Lower abdominal pain, Follow-up of colitis Providers:                Toribio Fortune, Olam Ada, RN, Daphne Mulch                            Technician, Technician Referring MD:              Medicines:                Monitored Anesthesia Care Complications:            No immediate complications. Estimated Blood Loss:     Estimated blood loss: none. Procedure:                Pre-Anesthesia Assessment:                           - Prior to the procedure, a History and Physical                            was performed, and patient medications, allergies                            and sensitivities were reviewed. The patient's                            tolerance of previous anesthesia was reviewed.                           - The risks and benefits of the procedure and the                            sedation options and risks were discussed with the                            patient. All questions were answered and informed                            consent was obtained.                           - ASA Grade Assessment: II - A patient with mild                            systemic disease.                           After obtaining informed consent, the colonoscope                            was passed under direct vision. Throughout the  procedure, the patient's blood pressure, pulse, and                            oxygen saturations were monitored continuously. The                            [id number]-HQ190L ([id number]) Peds Colon was introduced                            through the anus and advanced to the the cecum,                            identified by appendiceal orifice and ileocecal                             valve. The patient tolerated the procedure well.                            The quality of the bowel preparation was adequate.                            The colonoscopy was somewhat difficult due to                            significant looping. Successful completion of the                            procedure was aided by applying abdominal pressure. Scope In: 9:06:05 AM Scope Out: 9:48:37 AM Scope Withdrawal Time: 0 hours 24 minutes 55 seconds  Total Procedure Duration: 0 hours 42 minutes 32 seconds  Findings:      The perianal and digital rectal examinations were normal.      A 15 to 18 mm polyp was found in the cecum. The polyp was sessile. Area       was successfully injected with 4 mL Eleview for a lift polypectomy.       Imaging was performed using white light and narrow band imaging to       visualize the mucosa and demarcate the polyp site after injection for       EMR purposes. The polyp was removed with a hot snare. Resection and       retrieval were complete. To prevent bleeding after the polypectomy,       three hemostatic clips were successfully placed (MR safe). Clip       manufacturer: [employer]. There was no bleeding at the end of the       procedure.      Two sessile polyps were found in the transverse colon and cecum. The       polyps were 1 mm in size. These polyps were removed with a cold biopsy       forceps. Resection and retrieval were complete.      Three sessile polyps were found in the transverse colon and ascending       colon. The polyps were 3 to 5 mm in size. These polyps were removed with       a cold snare. Resection and retrieval were complete.  Scattered large-mouthed and small-mouthed diverticula were found in the       sigmoid colon, descending colon and ascending colon.      The retroflexed view of the distal rectum and anal verge was normal and       showed no anal or rectal abnormalities. Impression:                - One 15 to 18 mm polyp in the cecum, removed with                            a hot snare. Resected and retrieved via EMR. Clip                            manufacturer: [employer]. Clips (MR safe)                            were placed.                           - Two 1 mm polyps in the transverse colon and in                            the cecum, removed with a cold biopsy forceps.                            Resected and retrieved.                           - Three 3 to 5 mm polyps in the transverse colon                            and in the ascending colon, removed with a cold                            snare. Resected and retrieved.                           - Diverticulosis in the sigmoid colon, in the                            descending colon and in the ascending colon.                           - The distal rectum and anal verge are normal on                            retroflexion view. Moderate Sedation:      Per Anesthesia Care Recommendation:           - Discharge patient to home (ambulatory).                           - Resume previous diet.                           -  Await pathology results.                           - Repeat colonoscopy for surveillance based on                            pathology results. Procedure Code(s):        --- Professional ---                           [phone], 59, Colonoscopy, flexible; with removal of                            tumor(s), polyp(s), or other lesion(s) by snare                            technique                           45380, 59, Colonoscopy, flexible; with biopsy,                            single or multiple                           45381, Colonoscopy, flexible; with directed                            submucosal injection(s), any substance Diagnosis Code(s):        --- Professional ---                           D12.0, Benign neoplasm of cecum                           D12.3, Benign neoplasm of transverse colon  (hepatic                            flexure or splenic flexure)                           D12.2, Benign neoplasm of ascending colon                           R10.30, Lower abdominal pain, unspecified                           K52.9, Noninfective gastroenteritis and colitis,                            unspecified                           K57.30, Diverticulosis of large intestine without                            perforation or abscess without bleeding CPT copyright 2022 [employer]. All rights reserved. The  codes documented in this report are preliminary and upon coder review may  be revised to meet current compliance requirements. Toribio Fortune, MD Toribio Fortune,  12/24/2023 9:59:29 AM This report has been signed electronically. Number of Addenda: 0

## 2023-12-24 NOTE — Anesthesia Preprocedure Evaluation (Signed)
 Anesthesia Evaluation  Patient identified by MRN, date of birth, ID band Patient awake    Reviewed: Allergy & Precautions, H&P , NPO status , Patient's Chart, lab work & pertinent test results, reviewed documented beta blocker date and time   History of Anesthesia Complications (+) PONV and history of anesthetic complications  Airway Mallampati: II  TM Distance: >3 FB Neck ROM: full    Dental no notable dental hx.    Pulmonary neg pulmonary ROS   Pulmonary exam normal breath sounds clear to auscultation       Cardiovascular Exercise Tolerance: Good hypertension, negative cardio ROS  Rhythm:regular Rate:Normal     Neuro/Psych negative neurological ROS  negative psych ROS   GI/Hepatic Neg liver ROS,GERD  ,,  Endo/Other  negative endocrine ROS    Renal/GU Renal disease  negative genitourinary   Musculoskeletal   Abdominal   Peds  Hematology negative hematology ROS (+)   Anesthesia Other Findings   Reproductive/Obstetrics negative OB ROS                              Anesthesia Physical Anesthesia Plan  ASA: 3  Anesthesia Plan: General   Post-op Pain Management:    Induction:   PONV Risk Score and Plan: Propofol  infusion  Airway Management Planned:   Additional Equipment:   Intra-op Plan:   Post-operative Plan:   Informed Consent: I have reviewed the patients History and Physical, chart, labs and discussed the procedure including the risks, benefits and alternatives for the proposed anesthesia with the patient or authorized representative who has indicated his/her understanding and acceptance.     Dental Advisory Given  Plan Discussed with: CRNA  Anesthesia Plan Comments:         Anesthesia Quick Evaluation

## 2023-12-25 ENCOUNTER — Ambulatory Visit: Payer: Self-pay | Admitting: Gastroenterology

## 2023-12-25 LAB — SURGICAL PATHOLOGY

## 2023-12-26 ENCOUNTER — Encounter (HOSPITAL_COMMUNITY): Payer: Self-pay | Admitting: Gastroenterology

## 2024-01-01 ENCOUNTER — Telehealth (INDEPENDENT_AMBULATORY_CARE_PROVIDER_SITE_OTHER): Payer: Self-pay | Admitting: Gastroenterology

## 2024-01-01 NOTE — Telephone Encounter (Signed)
 Pt left voicemail with name, DOB and phone number. Returned call to patient. Pt states she was calling about her test results for her colonoscopy last week. Went on result letter with her and advised her that a letter was mailed out and she should get it soon. Pt verbalized understanding.

## 2024-01-06 ENCOUNTER — Encounter (INDEPENDENT_AMBULATORY_CARE_PROVIDER_SITE_OTHER): Payer: Self-pay | Admitting: *Deleted

## 2024-01-06 NOTE — Progress Notes (Signed)
 3 yr TCS noted in recall Patient's PCP is on EPIC procedure note and pathology result faxed to PCP

## 2024-02-12 ENCOUNTER — Encounter (INDEPENDENT_AMBULATORY_CARE_PROVIDER_SITE_OTHER): Payer: Self-pay | Admitting: Gastroenterology
